# Patient Record
Sex: Female | Born: 1965 | Race: White | Hispanic: No | Marital: Single | Smoking: Former smoker
Health system: Southern US, Community
[De-identification: ages and names within clinical notes are randomized; demographics above are authoritative.]

## PROBLEM LIST (undated history)

## (undated) DIAGNOSIS — Z8639 Personal history of other endocrine, nutritional and metabolic disease: Secondary | ICD-10-CM

## (undated) DIAGNOSIS — G9332 Myalgic encephalomyelitis/chronic fatigue syndrome: Secondary | ICD-10-CM

## (undated) DIAGNOSIS — Z87898 Personal history of other specified conditions: Secondary | ICD-10-CM

## (undated) DIAGNOSIS — N920 Excessive and frequent menstruation with regular cycle: Secondary | ICD-10-CM

## (undated) DIAGNOSIS — Z9289 Personal history of other medical treatment: Secondary | ICD-10-CM

## (undated) DIAGNOSIS — F329 Major depressive disorder, single episode, unspecified: Secondary | ICD-10-CM

## (undated) DIAGNOSIS — T7840XA Allergy, unspecified, initial encounter: Secondary | ICD-10-CM

## (undated) DIAGNOSIS — M1990 Unspecified osteoarthritis, unspecified site: Secondary | ICD-10-CM

## (undated) DIAGNOSIS — E079 Disorder of thyroid, unspecified: Secondary | ICD-10-CM

## (undated) DIAGNOSIS — H5789 Other specified disorders of eye and adnexa: Secondary | ICD-10-CM

## (undated) DIAGNOSIS — R5382 Chronic fatigue, unspecified: Secondary | ICD-10-CM

## (undated) DIAGNOSIS — E890 Postprocedural hypothyroidism: Secondary | ICD-10-CM

## (undated) DIAGNOSIS — E0500 Thyrotoxicosis with diffuse goiter without thyrotoxic crisis or storm: Secondary | ICD-10-CM

## (undated) DIAGNOSIS — F32A Depression, unspecified: Secondary | ICD-10-CM

## (undated) DIAGNOSIS — K219 Gastro-esophageal reflux disease without esophagitis: Secondary | ICD-10-CM

## (undated) HISTORY — DX: Unspecified osteoarthritis, unspecified site: M19.90

## (undated) HISTORY — DX: Chronic fatigue, unspecified: R53.82

## (undated) HISTORY — DX: Allergy, unspecified, initial encounter: T78.40XA

## (undated) HISTORY — DX: Myalgic encephalomyelitis/chronic fatigue syndrome: G93.32

## (undated) HISTORY — DX: Gastro-esophageal reflux disease without esophagitis: K21.9

## (undated) HISTORY — PX: TONSILLECTOMY: SUR1361

## (undated) HISTORY — DX: Other specified disorders of eye and adnexa: H57.89

## (undated) HISTORY — DX: Disorder of thyroid, unspecified: E07.9

## (undated) HISTORY — DX: Depression, unspecified: F32.A

## (undated) HISTORY — DX: Thyrotoxicosis with diffuse goiter without thyrotoxic crisis or storm: E05.00

## (undated) HISTORY — DX: Personal history of other medical treatment: Z92.89

---
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
## 1999-03-05 ENCOUNTER — Other Ambulatory Visit: Admission: RE | Admit: 1999-03-05 | Discharge: 1999-03-05 | Payer: Self-pay | Admitting: Obstetrics and Gynecology

## 2000-03-09 ENCOUNTER — Other Ambulatory Visit: Admission: RE | Admit: 2000-03-09 | Discharge: 2000-03-09 | Payer: Self-pay | Admitting: Obstetrics and Gynecology

## 2003-05-12 HISTORY — PX: DILATION AND EVACUATION: SHX1459

## 2008-01-30 ENCOUNTER — Encounter: Admission: RE | Admit: 2008-01-30 | Discharge: 2008-01-30 | Payer: Self-pay | Admitting: Obstetrics and Gynecology

## 2009-10-09 ENCOUNTER — Ambulatory Visit: Admission: RE | Admit: 2009-10-09 | Discharge: 2009-10-09 | Payer: Self-pay | Admitting: Gynecologic Oncology

## 2015-01-04 ENCOUNTER — Ambulatory Visit: Payer: 59 | Admitting: Podiatry

## 2015-01-15 ENCOUNTER — Encounter: Payer: Self-pay | Admitting: Podiatry

## 2015-01-15 ENCOUNTER — Ambulatory Visit (INDEPENDENT_AMBULATORY_CARE_PROVIDER_SITE_OTHER): Payer: Managed Care, Other (non HMO) | Admitting: Podiatry

## 2015-01-15 VITALS — BP 110/62 | HR 65 | Resp 16 | Ht 67.0 in | Wt 160.0 lb

## 2015-01-15 DIAGNOSIS — B070 Plantar wart: Secondary | ICD-10-CM | POA: Diagnosis not present

## 2015-01-15 NOTE — Progress Notes (Signed)
   Subjective:    Patient ID: Laurie Travis, female    DOB: Dec 30, 1965, 49 y.o.   MRN: [id number]  HPI  Patient presents with a wart on their Left foot, heel. Pt stated, "hurts foot". Pt has soaked foot in epsom salt with some relief. This has been going on for the past 4 months.    Review of Systems  All other systems reviewed and are negative.      Objective:   Physical Exam        Assessment & Plan:

## 2015-01-15 NOTE — Patient Instructions (Addendum)

## 2015-01-16 ENCOUNTER — Telehealth: Payer: Self-pay | Admitting: *Deleted

## 2015-01-16 DIAGNOSIS — B078 Other viral warts: Secondary | ICD-10-CM

## 2015-01-16 DIAGNOSIS — B079 Viral wart, unspecified: Secondary | ICD-10-CM

## 2015-01-16 NOTE — Telephone Encounter (Signed)
Left heel skin wedge sent to [facility] r/o verruca.

## 2015-01-16 NOTE — Telephone Encounter (Signed)
Left message at [phone] (Home #) for patient to call me back regarding a plantar wart procedure that was done on Tuesday, 01/15/15. Waiting for a response.

## 2015-01-16 NOTE — Progress Notes (Signed)
Subjective:     Patient ID: Laurie Travis, female   DOB: 12/04/1965, 49 y.o.   MRN: [id number]  HPI patient presents with painful lesion on the bottom of the left heel that's been present for several months. Patient does not remember specific injury and has tried to soak it in Epsom salts   Review of Systems  All other systems reviewed and are negative.      Objective:   Physical Exam  Constitutional: She is oriented to person, place, and time.  Cardiovascular: Intact distal pulses.   Musculoskeletal: Normal range of motion.  Neurological: She is oriented to person, place, and time.  Skin: Skin is warm.  Nursing note and vitals reviewed.  neurovascular status intact muscle strength adequate range of motion within normal limits and patient noted to have a lesion plantar aspect left heel that upon debridement shows pinpoint bleeding and is painful to lateral pressure. It measures approximately 7 mm x 8 mm and is again painful. Patient has good digital perfusion and is well oriented 3     Assessment:     Probable verruca plantaris plantar aspect left heel and cannot rule out other lesion or foreign body    Plan:     H&P and condition reviewed with patient. Today I injected with 60 mg Xylocaine with epinephrine and after appropriate prep to the area I removed the lesion in toto and applied phenol to the base and applied sterile dressing with padding. Patient was given instructions on continued soaks and padding and will be seen back to recheck and I did sent for pathological evaluation

## 2015-01-31 ENCOUNTER — Telehealth: Payer: Self-pay | Admitting: *Deleted

## 2015-01-31 NOTE — Telephone Encounter (Signed)
Dr. Charlsie Merles reviewed pt's biopsy 01/15/2015 as a plantar wart.  Left message for pt to call for results.

## 2015-03-18 ENCOUNTER — Encounter: Payer: Self-pay | Admitting: Podiatry

## 2016-01-02 NOTE — H&P (Addendum)
Stefano GaulJan Barich is an 50 y.o. female with menorrhagia presents for surgical mngt.  Pt has very heavy menses with clotting and severe cramps.     Menstrual History:  No LMP recorded.    Past Medical History:  Diagnosis Date  . CFS (chronic fatigue syndrome)   . History of ETT   . Palpitations 09/2011   NORMAL 24 HR HOLTER  . Postablative hypoparathyroidism   . Toxic thyroid nodule 1999    No past surgical history on file.  Family History  Problem Relation Age of Onset  . Heart Problems Father   . CAD Father   . Alzheimer's disease Maternal Grandfather     Social History:  reports that she has quit smoking. She quit smokeless tobacco use about 17 years ago. She reports that she drinks about 0.6 - 1.2 oz of alcohol per week . Her drug history is not on file.  Allergies:  Allergies  Allergen Reactions  . Pork-Derived Products Diarrhea  . Sulfa Antibiotics Other (See Comments)    ANAPHYLAXIS     No prescriptions prior to admission.  Pt takes multiple OTC supplements   ROS  AF, VSS Physical Exam  Gen - NAD CV - RRR Lungs - clear Abd - soft, NT Ext - NT PV - mobile, NT uterus.  No adnexal masses  Pap - negative US: uterus and adnexa wnl, no free fluid  Assessment/Plan: Menorrhagia Hysteroscopy, D&C, endometrial ablation R/b/a discussed, questions answered, informed consent  Nathanyl Andujo 01/02/2016, 1:52 PM

## 2016-01-08 ENCOUNTER — Encounter (HOSPITAL_BASED_OUTPATIENT_CLINIC_OR_DEPARTMENT_OTHER): Payer: Self-pay | Admitting: *Deleted

## 2016-01-08 NOTE — Progress Notes (Signed)
NPO AFTER MN.  ARRIVE AT 0600. NEEDS URINE PREG.  GETTING CBC DONE Thursday 01-09-2016.  WILL TAKE TIROSINT AM DOS W/ SIPS OF WATER.

## 2016-01-09 DIAGNOSIS — N920 Excessive and frequent menstruation with regular cycle: Secondary | ICD-10-CM | POA: Diagnosis present

## 2016-01-09 DIAGNOSIS — N858 Other specified noninflammatory disorders of uterus: Secondary | ICD-10-CM | POA: Diagnosis not present

## 2016-01-09 DIAGNOSIS — Z87891 Personal history of nicotine dependence: Secondary | ICD-10-CM | POA: Diagnosis not present

## 2016-01-09 LAB — CBC
HCT: 38.9 % (ref 36.0–46.0)
HEMOGLOBIN: 13.1 g/dL (ref 12.0–15.0)
MCH: 33.2 pg (ref 26.0–34.0)
MCHC: 33.7 g/dL (ref 30.0–36.0)
MCV: 98.7 fL (ref 78.0–100.0)
Platelets: 291 10*3/uL (ref 150–400)
RBC: 3.94 MIL/uL (ref 3.87–5.11)
RDW: 12.5 % (ref 11.5–15.5)
WBC: 8.3 10*3/uL (ref 4.0–10.5)

## 2016-01-13 ENCOUNTER — Encounter (HOSPITAL_BASED_OUTPATIENT_CLINIC_OR_DEPARTMENT_OTHER): Payer: Self-pay | Admitting: Anesthesiology

## 2016-01-13 NOTE — Anesthesia Preprocedure Evaluation (Addendum)
Anesthesia Evaluation  Patient identified by MRN, date of birth, ID band Patient awake    Reviewed: Allergy & Precautions, NPO status , Patient's Chart, lab work & pertinent test results  Airway Mallampati: III       Dental  (+) Chipped,    Pulmonary former smoker,    Pulmonary exam normal breath sounds clear to auscultation       Cardiovascular Normal cardiovascular exam Rhythm:Regular Rate:Normal  Hx/o Palpitations- Cardiac w/u negative   Neuro/Psych negative neurological ROS  negative psych ROS   GI/Hepatic negative GI ROS, Neg liver ROS,   Endo/Other  Hypothyroidism Hx/o Grave's disease- S/P RAI therapy  Renal/GU negative Renal ROS  negative genitourinary   Musculoskeletal Chronic fatigue syndrome   Abdominal   Peds  Hematology negative hematology ROS (+)   Anesthesia Other Findings   Reproductive/Obstetrics Menorrhagia                            Lab Results  Component Value Date   WBC 8.3 01/09/2016   HGB 13.1 01/09/2016   HCT 38.9 01/09/2016   MCV 98.7 01/09/2016   PLT 291 01/09/2016     Anesthesia Physical Anesthesia Plan  ASA: II  Anesthesia Plan: General   Post-op Pain Management:    Induction: Intravenous  Airway Management Planned: LMA  Additional Equipment:   Intra-op Plan:   Post-operative Plan: Extubation in OR  Informed Consent: I have reviewed the patients History and Physical, chart, labs and discussed the procedure including the risks, benefits and alternatives for the proposed anesthesia with the patient or authorized representative who has indicated his/her understanding and acceptance.   Dental advisory given  Plan Discussed with: Anesthesiologist, CRNA and Surgeon  Anesthesia Plan Comments:         Anesthesia Quick Evaluation

## 2016-01-14 ENCOUNTER — Ambulatory Visit (HOSPITAL_BASED_OUTPATIENT_CLINIC_OR_DEPARTMENT_OTHER)
Admission: RE | Admit: 2016-01-14 | Discharge: 2016-01-14 | Disposition: A | Discharge status: Home / Self Care | Payer: BLUE CROSS/BLUE SHIELD | Source: Ambulatory Visit | Attending: Obstetrics and Gynecology | Admitting: Obstetrics and Gynecology

## 2016-01-14 ENCOUNTER — Encounter (HOSPITAL_BASED_OUTPATIENT_CLINIC_OR_DEPARTMENT_OTHER): Payer: Self-pay | Admitting: *Deleted

## 2016-01-14 ENCOUNTER — Encounter (HOSPITAL_BASED_OUTPATIENT_CLINIC_OR_DEPARTMENT_OTHER)
Admission: RE | Disposition: A | Discharge status: Home / Self Care | Payer: Self-pay | Source: Ambulatory Visit | Attending: Obstetrics and Gynecology

## 2016-01-14 ENCOUNTER — Ambulatory Visit (HOSPITAL_BASED_OUTPATIENT_CLINIC_OR_DEPARTMENT_OTHER): Payer: BLUE CROSS/BLUE SHIELD | Admitting: Anesthesiology

## 2016-01-14 DIAGNOSIS — N920 Excessive and frequent menstruation with regular cycle: Secondary | ICD-10-CM | POA: Diagnosis not present

## 2016-01-14 DIAGNOSIS — Z87891 Personal history of nicotine dependence: Secondary | ICD-10-CM | POA: Insufficient documentation

## 2016-01-14 DIAGNOSIS — N858 Other specified noninflammatory disorders of uterus: Secondary | ICD-10-CM | POA: Insufficient documentation

## 2016-01-14 HISTORY — DX: Personal history of other endocrine, nutritional and metabolic disease: Z86.39

## 2016-01-14 HISTORY — DX: Postprocedural hypothyroidism: E89.0

## 2016-01-14 HISTORY — DX: Personal history of other specified conditions: Z87.898

## 2016-01-14 HISTORY — PX: DILITATION & CURRETTAGE/HYSTROSCOPY WITH NOVASURE ABLATION: SHX5568

## 2016-01-14 HISTORY — DX: Excessive and frequent menstruation with regular cycle: N92.0

## 2016-01-14 LAB — POCT PREGNANCY, URINE: PREG TEST UR: NEGATIVE

## 2016-01-14 SURGERY — DILATATION & CURETTAGE/HYSTEROSCOPY WITH NOVASURE ABLATION
Anesthesia: General

## 2016-01-14 MED ORDER — IBUPROFEN 600 MG PO TABS: 600.0000 mg | ORAL_TABLET | Freq: Four times a day (QID) | ORAL | 1 refills | Status: DC | PRN

## 2016-01-14 MED ORDER — MIDAZOLAM HCL 2 MG/2ML IJ SOLN
INTRAMUSCULAR | Status: AC
  Filled 2016-01-14: qty 2

## 2016-01-14 MED ORDER — CEFOTETAN DISODIUM-DEXTROSE 2-2.08 GM-% IV SOLR
INTRAVENOUS | Status: AC
  Filled 2016-01-14: qty 50

## 2016-01-14 MED ORDER — MIDAZOLAM HCL 5 MG/5ML IJ SOLN
INTRAMUSCULAR | Status: DC | PRN
  Administered 2016-01-14: 2 mg via INTRAVENOUS

## 2016-01-14 MED ORDER — LACTATED RINGERS IR SOLN
Status: DC | PRN
  Administered 2016-01-14: 3000 mL

## 2016-01-14 MED ORDER — METOCLOPRAMIDE HCL 5 MG/ML IJ SOLN
10.0000 mg | Freq: Once | INTRAMUSCULAR | Status: DC | PRN
  Filled 2016-01-14: qty 2

## 2016-01-14 MED ORDER — ONDANSETRON HCL 4 MG/2ML IJ SOLN
INTRAMUSCULAR | Status: DC | PRN
  Administered 2016-01-14: 4 mg via INTRAVENOUS

## 2016-01-14 MED ORDER — HYDROCODONE-ACETAMINOPHEN 7.5-325 MG PO TABS
1.0000 | ORAL_TABLET | Freq: Once | ORAL | Status: DC | PRN
  Filled 2016-01-14: qty 1

## 2016-01-14 MED ORDER — LIDOCAINE HCL 1 % IJ SOLN
INTRAMUSCULAR | Status: DC | PRN
  Administered 2016-01-14: 1 mL
  Administered 2016-01-14: 5 mL

## 2016-01-14 MED ORDER — FENTANYL CITRATE (PF) 100 MCG/2ML IJ SOLN
INTRAMUSCULAR | Status: DC | PRN
  Administered 2016-01-14: 25 ug via INTRAVENOUS
  Administered 2016-01-14: 50 ug via INTRAVENOUS
  Administered 2016-01-14: 25 ug via INTRAVENOUS

## 2016-01-14 MED ORDER — DEXTROSE 5 % IV SOLN
2.0000 g | INTRAVENOUS | Status: AC
  Administered 2016-01-14: 2 g via INTRAVENOUS
  Filled 2016-01-14: qty 2

## 2016-01-14 MED ORDER — DEXAMETHASONE SODIUM PHOSPHATE 4 MG/ML IJ SOLN
INTRAMUSCULAR | Status: DC | PRN
  Administered 2016-01-14: 10 mg via INTRAVENOUS

## 2016-01-14 MED ORDER — DEXAMETHASONE SODIUM PHOSPHATE 10 MG/ML IJ SOLN
INTRAMUSCULAR | Status: AC
  Filled 2016-01-14: qty 1

## 2016-01-14 MED ORDER — PROPOFOL 10 MG/ML IV BOLUS
INTRAVENOUS | Status: AC
  Filled 2016-01-14: qty 20

## 2016-01-14 MED ORDER — PROPOFOL 10 MG/ML IV BOLUS
INTRAVENOUS | Status: DC | PRN
  Administered 2016-01-14: 200 mg via INTRAVENOUS

## 2016-01-14 MED ORDER — FENTANYL CITRATE (PF) 100 MCG/2ML IJ SOLN
INTRAMUSCULAR | Status: AC
  Filled 2016-01-14: qty 2

## 2016-01-14 MED ORDER — LACTATED RINGERS IV SOLN
INTRAVENOUS | Status: DC
  Administered 2016-01-14: 07:00:00 via INTRAVENOUS
  Filled 2016-01-14: qty 1000

## 2016-01-14 MED ORDER — MEPERIDINE HCL 25 MG/ML IJ SOLN
6.2500 mg | INTRAMUSCULAR | Status: DC | PRN
  Filled 2016-01-14: qty 1

## 2016-01-14 MED ORDER — ONDANSETRON HCL 4 MG/2ML IJ SOLN
INTRAMUSCULAR | Status: AC
  Filled 2016-01-14: qty 2

## 2016-01-14 MED ORDER — KETOROLAC TROMETHAMINE 30 MG/ML IJ SOLN
INTRAMUSCULAR | Status: AC
  Filled 2016-01-14: qty 1

## 2016-01-14 MED ORDER — FENTANYL CITRATE (PF) 100 MCG/2ML IJ SOLN
25.0000 ug | INTRAMUSCULAR | Status: DC | PRN
  Filled 2016-01-14: qty 1

## 2016-01-14 MED ORDER — LIDOCAINE 2% (20 MG/ML) 5 ML SYRINGE
INTRAMUSCULAR | Status: AC
  Filled 2016-01-14: qty 5

## 2016-01-14 MED ORDER — OXYCODONE-ACETAMINOPHEN 5-325 MG PO TABS: 1.0000 | ORAL_TABLET | ORAL | 0 refills | Status: DC | PRN

## 2016-01-14 MED ORDER — KETOROLAC TROMETHAMINE 30 MG/ML IJ SOLN
INTRAMUSCULAR | Status: DC | PRN
  Administered 2016-01-14: 30 mg via INTRAVENOUS

## 2016-01-14 MED ORDER — LIDOCAINE 2% (20 MG/ML) 5 ML SYRINGE
INTRAMUSCULAR | Status: DC | PRN
  Administered 2016-01-14: 60 mg via INTRAVENOUS

## 2016-01-14 SURGICAL SUPPLY — 34 items
ABLATOR ENDOMETRIAL BIPOLAR (ABLATOR) ×2 IMPLANT
BIPOLAR CUTTING LOOP 21FR (ELECTRODE)
CANISTER SUCTION 2500CC (MISCELLANEOUS) ×4 IMPLANT
CATH ROBINSON RED A/P 16FR (CATHETERS) ×3 IMPLANT
COVER BACK TABLE 60X90IN (DRAPES) ×3 IMPLANT
DRAPE HYSTEROSCOPY (DRAPE) ×3 IMPLANT
DRAPE LG THREE QUARTER DISP (DRAPES) ×3 IMPLANT
DRSG TELFA 3X8 NADH (GAUZE/BANDAGES/DRESSINGS) ×3 IMPLANT
GAUZE VASELINE 1X8 (GAUZE/BANDAGES/DRESSINGS) IMPLANT
GLOVE BIO SURGEON STRL SZ 6.5 (GLOVE) ×5 IMPLANT
GLOVE BIO SURGEONS STRL SZ 6.5 (GLOVE) ×3
GLOVE BIOGEL PI IND STRL 7.0 (GLOVE) ×1 IMPLANT
GLOVE BIOGEL PI INDICATOR 7.0 (GLOVE) ×2
GOWN STRL REUS W/ TWL LRG LVL3 (GOWN DISPOSABLE) ×1 IMPLANT
GOWN STRL REUS W/ TWL XL LVL3 (GOWN DISPOSABLE) ×1 IMPLANT
GOWN STRL REUS W/TWL LRG LVL3 (GOWN DISPOSABLE) ×3
GOWN STRL REUS W/TWL XL LVL3 (GOWN DISPOSABLE) ×3
IV LACTATED RINGER IRRG 3000ML (IV SOLUTION) ×3
IV LR IRRIG 3000ML ARTHROMATIC (IV SOLUTION) IMPLANT
KIT ROOM TURNOVER WOR (KITS) ×3 IMPLANT
LEGGING LITHOTOMY PAIR STRL (DRAPES) ×3 IMPLANT
LOOP CUTTING BIPOLAR 21FR (ELECTRODE) IMPLANT
NDL SPNL 22GX3.5 QUINCKE BK (NEEDLE) ×1 IMPLANT
NEEDLE SPNL 22GX3.5 QUINCKE BK (NEEDLE) ×3 IMPLANT
NS IRRIG 500ML POUR BTL (IV SOLUTION) ×2 IMPLANT
PACK BASIN DAY SURGERY FS (CUSTOM PROCEDURE TRAY) ×3 IMPLANT
PAD DRESSING TELFA 3X8 NADH (GAUZE/BANDAGES/DRESSINGS) ×1 IMPLANT
PAD OB MATERNITY 4.3X12.25 (PERSONAL CARE ITEMS) ×3 IMPLANT
PAD PREP 24X48 CUFFED NSTRL (MISCELLANEOUS) ×3 IMPLANT
SYR CONTROL 10ML LL (SYRINGE) ×3 IMPLANT
TOWEL OR 17X24 6PK STRL BLUE (TOWEL DISPOSABLE) ×6 IMPLANT
TRAY DSU PREP LF (CUSTOM PROCEDURE TRAY) ×3 IMPLANT
TUBE CONNECTING 12'X1/4 (SUCTIONS)
TUBE CONNECTING 12X1/4 (SUCTIONS) IMPLANT

## 2016-01-14 NOTE — Anesthesia Procedure Notes (Signed)
Procedure Name: LMA Insertion Date/Time: 01/14/2016 7:28 AM Performed by: Renella CunasHAZEL, Aleighya Mcanelly D Pre-anesthesia Checklist: Patient identified, Emergency Drugs available, Suction available and Patient being monitored Patient Re-evaluated:Patient Re-evaluated prior to inductionOxygen Delivery Method: Circle system utilized Preoxygenation: Pre-oxygenation with 100% oxygen Intubation Type: IV induction Ventilation: Mask ventilation without difficulty LMA: LMA inserted LMA Size: 4.0 Number of attempts: 1 Airway Equipment and Method: Bite block Placement Confirmation: positive ETCO2 Tube secured with: Tape Dental Injury: Teeth and Oropharynx as per pre-operative assessment

## 2016-01-14 NOTE — Transfer of Care (Signed)
Immediate Anesthesia Transfer of Care Note  Patient: Laurie Travis  Procedure(s) Performed: Procedure(s) (LRB): DILATATION & CURETTAGE/HYSTEROSCOPY WITH NOVASURE ABLATION (N/A)  Patient Location: PACU  Anesthesia Type: General  Level of Consciousness: awake, oriented, sedated and patient cooperative  Airway & Oxygen Therapy: Patient Spontanous Breathing and Patient connected to face mask oxygen  Post-op Assessment: Report given to PACU RN and Post -op Vital signs reviewed and stable  Post vital signs: Reviewed and stable  Complications: No apparent anesthesia complications Last Vitals:  Vitals:   01/14/16 0613 01/14/16 0801  BP: 121/69   Pulse: (!) 58   Resp: 16 12  Temp: 36.7 C 36.6 C

## 2016-01-14 NOTE — Anesthesia Postprocedure Evaluation (Signed)
Anesthesia Post Note  Patient: Laurie GaulJan Travis  Procedure(s) Performed: Procedure(s) (LRB): DILATATION & CURETTAGE/HYSTEROSCOPY WITH NOVASURE ABLATION (N/A)  Patient location during evaluation: PACU Anesthesia Type: General Level of consciousness: awake and alert and oriented Pain management: pain level controlled Vital Signs Assessment: post-procedure vital signs reviewed and stable Respiratory status: spontaneous breathing, nonlabored ventilation and respiratory function stable Cardiovascular status: blood pressure returned to baseline and stable Postop Assessment: no signs of nausea or vomiting Anesthetic complications: no    Last Vitals:  Vitals:   01/14/16 0815 01/14/16 0830  BP: (!) 113/58 108/83  Pulse: 60 (!) 56  Resp: 16 14  Temp:      Last Pain:  Vitals:   01/14/16 0801  TempSrc:   PainSc: 0-No pain                 [name] A.

## 2016-01-14 NOTE — Discharge Instructions (Signed)

## 2016-01-15 ENCOUNTER — Encounter (HOSPITAL_BASED_OUTPATIENT_CLINIC_OR_DEPARTMENT_OTHER): Payer: Self-pay | Admitting: Obstetrics and Gynecology

## 2016-01-15 NOTE — Op Note (Signed)
[name], Laurie Travis NO.:  [id number]  MEDICAL RECORD NO.:  [id number]  LOCATION:                                 FACILITY:  PHYSICIAN:  Zelphia Cairo, MD         DATE OF BIRTH:  DATE OF PROCEDURE:  01/14/2016 DATE OF DISCHARGE:                              OPERATIVE REPORT   PREOPERATIVE DIAGNOSIS:  Menorrhagia.  POSTOPERATIVE DIAGNOSIS:  Menorrhagia.  OPERATIONS: 1. Paracervical block. 2. Hysteroscopy, dilation and curettage. 3. NovaSure endometrial ablation.  SURGEON:  Zelphia Cairo, MD.  BLOOD LOSS:  Minimal.  SPECIMEN:  Endometrial curettings.  ANESTHESIA:  General with local.  PROCEDURE IN DETAIL:  The patient was taken to the operating room. After informed consent was obtained, she was given general anesthesia, placed in the dorsal lithotomy position using Allen stirrups.  She was prepped and draped in a sterile fashion, and an in-and-out catheter was used to drain her bladder for an unmeasured amount of urine.  Bivalve speculum was placed in the vagina.  1% lidocaine was used to perform a paracervical block.  Single-tooth tenaculum was attached to the anterior lip of the cervix.  The cervix was serially dilated using Pratt dilators, and the uterine cavity was measured using a sound. Hysteroscope was then inserted, and a survey was performed.  There was some fluffy endometrial tissue, but no masses, polyps, or fibroids were identified.  Hysteroscope was removed, and a gentle curetting was then performed.  Specimen was placed on Telfa and passed off to be sent to Pathology.  The NovaSure device was then inserted.  The cavity width was measured, and the NovaSure ablation was performed using standard manufacture guidelines.  Once the cycle was complete, the device was removed.  All instruments were then removed from the vagina.  The cervix was hemostatic.  She was extubated and taken to the recovery room in stable condition.  Sponge, lap,  needle, and instrument counts were correct x2.    Zelphia Cairo, MD    GA/MEDQ  D:  01/14/2016  T:  01/15/2016  Job:  [id number]

## 2019-07-08 ENCOUNTER — Ambulatory Visit: Payer: Commercial Managed Care - PPO | Attending: Internal Medicine

## 2019-07-08 ENCOUNTER — Other Ambulatory Visit: Payer: Self-pay

## 2019-07-08 DIAGNOSIS — Z23 Encounter for immunization: Secondary | ICD-10-CM | POA: Insufficient documentation

## 2019-07-08 NOTE — Progress Notes (Signed)
   Covid-19 Vaccination Clinic  Name:  Chaunta Bejarano    MRN: [id number] DOB: [birth year]  07/08/2019  Ms. Heinemann was observed post Covid-19 immunization for 15 minutes without incidence. She was provided with Vaccine Information Sheet and instruction to access the V-Safe system.   Ms. Vantil was instructed to call 911 with any severe reactions post vaccine: Marland Kitchen Difficulty breathing  . Swelling of your face and throat  . A fast heartbeat  . A bad rash all over your body  . Dizziness and weakness    Immunizations Administered    Name Date Dose VIS Date Route   Pfizer COVID-19 Vaccine 07/08/2019 10:03 AM 0.3 mL 04/21/2019 Intramuscular   Manufacturer: [employer], [employer]   Lot: [id number]   NDC: [address]-6

## 2019-07-13 ENCOUNTER — Encounter: Payer: Self-pay | Admitting: Gastroenterology

## 2019-07-20 ENCOUNTER — Other Ambulatory Visit: Payer: Self-pay

## 2019-07-20 ENCOUNTER — Ambulatory Visit (INDEPENDENT_AMBULATORY_CARE_PROVIDER_SITE_OTHER): Payer: Commercial Managed Care - PPO | Admitting: Podiatry

## 2019-07-20 ENCOUNTER — Encounter: Payer: Self-pay | Admitting: Podiatry

## 2019-07-20 VITALS — Temp 97.0°F

## 2019-07-20 DIAGNOSIS — B070 Plantar wart: Secondary | ICD-10-CM

## 2019-07-20 NOTE — Patient Instructions (Signed)

## 2019-07-20 NOTE — Progress Notes (Signed)
Subjective:   Patient ID: Laurie Travis, female   DOB: 54 y.o.   MRN: [id number]   HPI Patient presents with a chronic lesion on the bottom of the left heel which has become sore stating that after we had done it a number of years ago did very well but it has become aggravated again over the last few months and she knows that there is something there   Review of Systems  All other systems reviewed and are negative.       Objective:  Physical Exam Vitals and nursing note reviewed.  Constitutional:      Appearance: She is well-developed.  Pulmonary:     Effort: Pulmonary effort is normal.  Musculoskeletal:        General: Normal range of motion.  Skin:    General: Skin is warm.  Neurological:     Mental Status: She is alert.   Patient presents stating that this has really been bothering her today neurovascular status noted to be intact.  Patient is found to have a 1.2 cm mass plantar aspect left heel that appears to be slightly off center from the previous one that was done that is painful when pressed and make shoe gear difficult.  Patient has good digital perfusion well oriented x3      Assessment:  Verruca plantaris plantar aspect left with pain     Plan:  H&P condition reviewed and at this point we discussed different treatment options and she is opted for excision.  I did sterile prep the area I did inject 60 mg Xylocaine with epinephrine around the area in order to stop the blood flow and then using sterile instrumentation I excised the mass I exposed base I utilize bone curettage and phenol sent this off for pathology and applied sterile dressing.  Gave instructions for elevation soaks and patient will be seen back and we will get the results of pathology

## 2019-07-26 ENCOUNTER — Other Ambulatory Visit: Payer: Self-pay

## 2019-07-26 ENCOUNTER — Ambulatory Visit (AMBULATORY_SURGERY_CENTER): Payer: Self-pay | Admitting: *Deleted

## 2019-07-26 VITALS — Temp 96.8°F | Ht 66.0 in | Wt 213.0 lb

## 2019-07-26 DIAGNOSIS — Z1211 Encounter for screening for malignant neoplasm of colon: Secondary | ICD-10-CM

## 2019-07-26 MED ORDER — SUPREP BOWEL PREP KIT 17.5-3.13-1.6 GM/177ML PO SOLN: 1.0000 | Freq: Once | ORAL | 0 refills | Status: AC

## 2019-07-26 NOTE — Progress Notes (Signed)
Pt will receive COV Vacc #2 07-29-2019- she will not require a pre screen cov test for gher 4-9 colon   No egg or soy allergy known to patient  No issues with past sedation with any surgeries  or procedures, no intubation problems  No diet pills per patient No home 02 use per patient  No blood thinners per patient  Pt denies issues with constipation  No A fib or A flutter  EMMI video sent to pt's e mail   Due to the COVID-19 pandemic we are asking patients to follow these guidelines. Please only bring one care partner. Please be aware that your care partner may wait in the car in the parking lot or if they feel like they will be too hot to wait in the car, they may wait in the lobby on the 4th floor. All care partners are required to wear a mask the entire time (we do not have any that we can provide them), they need to practice social distancing, and we will do a Covid check for all patient's and care partners when you arrive. Also we will check their temperature and your temperature. If the care partner waits in their car they need to stay in the parking lot the entire time and we will call them on their cell phone when the patient is ready for discharge so they can bring the car to the front of the building. Also all patient's will need to wear a mask into building. Suprep code to pharmacy and coupon to pt ion pv

## 2019-07-29 ENCOUNTER — Ambulatory Visit: Payer: Commercial Managed Care - PPO | Attending: Internal Medicine

## 2019-07-29 DIAGNOSIS — Z23 Encounter for immunization: Secondary | ICD-10-CM

## 2019-07-29 NOTE — Progress Notes (Signed)
   Covid-19 Vaccination Clinic  Name:  Laurie Travis    MRN: [id number] DOB: 1966/02/11  07/29/2019  Laurie Travis was observed post Covid-19 immunization for 15 minutes without incident. She was provided with Vaccine Information Sheet and instruction to access the V-Safe system.   Laurie Travis was instructed to call 911 with any severe reactions post vaccine: Marland Kitchen Difficulty breathing  . Swelling of face and throat  . A fast heartbeat  . A bad rash all over body  . Dizziness and weakness   Immunizations Administered    Name Date Dose VIS Date Route   Pfizer COVID-19 Vaccine 07/29/2019 10:55 AM 0.3 mL 04/21/2019 Intramuscular   Manufacturer: [employer], [employer]   Lot: [id number]   NDC: [address]-2

## 2019-08-02 ENCOUNTER — Ambulatory Visit: Payer: Self-pay

## 2019-08-17 ENCOUNTER — Encounter: Payer: Self-pay | Admitting: Gastroenterology

## 2019-08-18 ENCOUNTER — Ambulatory Visit (AMBULATORY_SURGERY_CENTER): Payer: Commercial Managed Care - PPO | Admitting: Gastroenterology

## 2019-08-18 ENCOUNTER — Encounter: Payer: Self-pay | Admitting: Gastroenterology

## 2019-08-18 ENCOUNTER — Other Ambulatory Visit: Payer: Self-pay

## 2019-08-18 VITALS — BP 131/63 | HR 62 | Temp 97.5°F | Resp 16 | Ht 66.0 in | Wt 213.0 lb

## 2019-08-18 DIAGNOSIS — Z1211 Encounter for screening for malignant neoplasm of colon: Secondary | ICD-10-CM | POA: Diagnosis not present

## 2019-08-18 MED ORDER — SODIUM CHLORIDE 0.9 % IV SOLN: 500.0000 mL | Freq: Once | INTRAVENOUS | Status: DC

## 2019-08-18 NOTE — Progress Notes (Signed)
Temp by JB Vitals by CW  Pt's states no medical or surgical changes since previsit or office visit.  

## 2019-08-18 NOTE — Progress Notes (Signed)
To PACU, VSS. Report to [email or web address] 

## 2019-08-18 NOTE — Op Note (Signed)
[place] Patient Name: Laurie Travis Procedure Date: 08/18/2019 1:30 PM MRN: [id number] Endoscopist: Mauri Pole , MD Age: 54 Referring MD:  Date of Birth: July 15, 1965 Gender: Female Account #: [id number] Procedure:                Colonoscopy Indications:              Screening for colorectal malignant neoplasm Medicines:                Monitored Anesthesia Care Procedure:                Pre-Anesthesia Assessment:                           - Prior to the procedure, a History and Physical                            was performed, and patient medications and                            allergies were reviewed. The patient's tolerance of                            previous anesthesia was also reviewed. The risks                            and benefits of the procedure and the sedation                            options and risks were discussed with the patient.                            All questions were answered, and informed consent                            was obtained. Prior Anticoagulants: The patient has                            taken no previous anticoagulant or antiplatelet                            agents. ASA Grade Assessment: II - A patient with                            mild systemic disease. After reviewing the risks                            and benefits, the patient was deemed in                            satisfactory condition to undergo the procedure.                           After obtaining informed consent, the colonoscope  was passed under direct vision. Throughout the                            procedure, the patient's blood pressure, pulse, and                            oxygen saturations were monitored continuously. The                            Colonoscope was introduced through the anus and                            advanced to the the cecum, identified by                            appendiceal orifice and  ileocecal valve. The                            colonoscopy was performed without difficulty. The                            patient tolerated the procedure well. The quality                            of the bowel preparation was excellent. The                            ileocecal valve, appendiceal orifice, and rectum                            were photographed. Scope In: 1:35:52 PM Scope Out: 1:55:06 PM Scope Withdrawal Time: 0 hours 15 minutes 36 seconds  Total Procedure Duration: 0 hours 19 minutes 14 seconds  Findings:                 The perianal and digital rectal examinations were                            normal.                           Non-bleeding internal hemorrhoids were found during                            retroflexion. The hemorrhoids were small.                           The exam was otherwise without abnormality. Complications:            No immediate complications. Estimated Blood Loss:     Estimated blood loss was minimal. Impression:               - Non-bleeding internal hemorrhoids.                           - The examination was otherwise normal.                           -  No specimens collected. Recommendation:           - Patient has a contact number available for                            emergencies. The signs and symptoms of potential                            delayed complications were discussed with the                            patient. Return to normal activities tomorrow.                            Written discharge instructions were provided to the                            patient.                           - Resume previous diet.                           - Continue present medications.                           - Repeat colonoscopy in 10 years for screening                            purposes. Napoleon Form, MD 08/18/2019 2:00:36 PM This report has been signed electronically.

## 2019-08-18 NOTE — Patient Instructions (Addendum)
Impression/Recommendations:  Hemorrhoid handout given to patient.  Resume previous diet. Continue present medications.  Repeat colonoscopy I 10 years for screening purposes.  Call Dr. Elana Alm office if interested in banding procedure for hemorrhoids.    YOU HAD AN ENDOSCOPIC PROCEDURE TODAY AT THE [facility]:   Refer to the procedure report that was given to you for any specific questions about what was found during the examination.  If the procedure report does not answer your questions, please call your gastroenterologist to clarify.  If you requested that your care partner not be given the details of your procedure findings, then the procedure report has been included in a sealed envelope for you to review at your convenience later.  YOU SHOULD EXPECT: Some feelings of bloating in the abdomen. Passage of more gas than usual.  Walking can help get rid of the air that was put into your GI tract during the procedure and reduce the bloating. If you had a lower endoscopy (such as a colonoscopy or flexible sigmoidoscopy) you may notice spotting of blood in your stool or on the toilet paper. If you underwent a bowel prep for your procedure, you may not have a normal bowel movement for a few days.  Please Note:  You might notice some irritation and congestion in your nose or some drainage.  This is from the oxygen used during your procedure.  There is no need for concern and it should clear up in a day or so.  SYMPTOMS TO REPORT IMMEDIATELY:   Following lower endoscopy (colonoscopy or flexible sigmoidoscopy):  Excessive amounts of blood in the stool  Significant tenderness or worsening of abdominal pains  Swelling of the abdomen that is new, acute  Fever of 100F or higher  For urgent or emergent issues, a gastroenterologist can be reached at any hour by calling [phone]. Do not use MyChart messaging for urgent concerns.    DIET:  We do recommend a small meal at  first, but then you may proceed to your regular diet.  Drink plenty of fluids but you should avoid alcoholic beverages for 24 hours.  ACTIVITY:  You should plan to take it easy for the rest of today and you should NOT DRIVE or use heavy machinery until tomorrow (because of the sedation medicines used during the test).    FOLLOW UP: Our staff will call the number listed on your records 48-72 hours following your procedure to check on you and address any questions or concerns that you may have regarding the information given to you following your procedure. If we do not reach you, we will leave a message.  We will attempt to reach you two times.  During this call, we will ask if you have developed any symptoms of COVID 19. If you develop any symptoms (ie: fever, flu-like symptoms, shortness of breath, cough etc.) before then, please call [phone].  If you test positive for Covid 19 in the 2 weeks post procedure, please call and report this information to [place].    If any biopsies were taken you will be contacted by phone or by letter within the next 1-3 weeks.  Please call us at [phone] if you have not heard about the biopsies in 3 weeks.    SIGNATURES/CONFIDENTIALITY: You and/or your care partner have signed paperwork which will be entered into your electronic medical record.  These signatures attest to the fact that that the information above on your After Visit Summary has been reviewed  and is understood.  Full responsibility of the confidentiality of this discharge information lies with you and/or your care-partner. 

## 2019-08-22 ENCOUNTER — Telehealth: Payer: Self-pay

## 2019-08-22 NOTE — Telephone Encounter (Signed)
  Follow up Call-  Call back number 08/18/2019  Post procedure Call Back phone  # [phone]  Permission to leave phone message Yes  Some recent data might be hidden     Patient questions:  Do you have a fever, pain , or abdominal swelling? No. Pain Score  0 *  Have you tolerated food without any problems? Yes.    Have you been able to return to your normal activities? Yes.    Do you have any questions about your discharge instructions: Diet   No. Medications  No. Follow up visit  No.  Do you have questions or concerns about your Care? No.  Actions: * If pain score is 4 or above: No action needed, pain <4. 1. Have you developed a fever since your procedure? no  2.   Have you had an respiratory symptoms (SOB or cough) since your procedure? no  3.   Have you tested positive for COVID 19 since your procedure no  4.   Have you had any family members/close contacts diagnosed with the COVID 19 since your procedure?  no   If yes to any of these questions please route to Laverna Peace, RN and Charlett Lango, RN

## 2020-02-02 ENCOUNTER — Other Ambulatory Visit: Payer: Self-pay | Admitting: *Deleted

## 2020-02-02 ENCOUNTER — Other Ambulatory Visit: Payer: Self-pay | Admitting: Surgery

## 2020-02-02 DIAGNOSIS — E0500 Thyrotoxicosis with diffuse goiter without thyrotoxic crisis or storm: Secondary | ICD-10-CM

## 2020-02-15 ENCOUNTER — Ambulatory Visit
Admission: RE | Admit: 2020-02-15 | Discharge: 2020-02-15 | Disposition: A | Discharge status: Home / Self Care | Payer: Commercial Managed Care - PPO | Source: Ambulatory Visit | Attending: Surgery | Admitting: Surgery

## 2020-02-15 DIAGNOSIS — E0500 Thyrotoxicosis with diffuse goiter without thyrotoxic crisis or storm: Secondary | ICD-10-CM

## 2020-03-07 ENCOUNTER — Encounter: Payer: Self-pay | Admitting: Neurology

## 2020-05-22 ENCOUNTER — Encounter: Payer: Self-pay | Admitting: Diagnostic Neuroimaging

## 2020-05-22 ENCOUNTER — Other Ambulatory Visit: Payer: Self-pay

## 2020-05-22 ENCOUNTER — Ambulatory Visit (INDEPENDENT_AMBULATORY_CARE_PROVIDER_SITE_OTHER): Payer: Commercial Managed Care - PPO | Admitting: Diagnostic Neuroimaging

## 2020-05-22 VITALS — BP 122/80 | HR 80 | Ht 66.0 in | Wt 225.0 lb

## 2020-05-22 DIAGNOSIS — M79671 Pain in right foot: Secondary | ICD-10-CM | POA: Diagnosis not present

## 2020-05-22 DIAGNOSIS — R5383 Other fatigue: Secondary | ICD-10-CM

## 2020-05-22 DIAGNOSIS — R4189 Other symptoms and signs involving cognitive functions and awareness: Secondary | ICD-10-CM

## 2020-05-22 DIAGNOSIS — M79672 Pain in left foot: Secondary | ICD-10-CM | POA: Diagnosis not present

## 2020-05-22 NOTE — Progress Notes (Signed)
o

## 2020-05-22 NOTE — Progress Notes (Signed)
JCV blood specimen placed din [facility] lock box for pick up.

## 2020-05-22 NOTE — Progress Notes (Signed)
[facility]  PATIENT: Laurie Travis DOB: 06-Oct-1965  REFERRING CLINICIAN: Eliezer Lofts, MD HISTORY FROM: patient  REASON FOR VISIT: new consult    HISTORICAL  CHIEF COMPLAINT:  Chief Complaint  Patient presents with  . Neuralgia    Rm 7 New Pt "pain, inflammation, thyroid eye disease, suspect MS"    HISTORY OF PRESENT ILLNESS:   55 year old female here for evaluation of malaise, brain fog, weight gain, pain in feet and urinary control issues.  Symptoms started in 2021.  Went to eye doctor for double vision was diagnosed with thyroid eye disease and treated with prednisone in October 2021 with some benefit.  Interestingly her other symptoms also improved while on prednisone.  In 2000 patient was diagnosed with chronic fatigue syndrome.  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  Possibility of MS was raised but she did not have any further follow-up with neurology at that time.   REVIEW OF SYSTEMS: Full 14 system review of systems performed and negative with exception of: As per HPI.  ALLERGIES: Allergies  Allergen Reactions  . Pork-Derived Products Diarrhea  . Sulfa Antibiotics Hives         HOME MEDICATIONS: Outpatient Medications Prior to Visit  Medication Sig Dispense Refill  . B-12, Methylcobalamin, 1000 MCG SUBL Place under the tongue daily.    Marland Kitchen Bioflavonoid Products (ESTER-C) TABS Take by mouth daily.    . Cats Claw, Uncaria tomentosa, (CATS CLAW PO) Take by mouth daily.    . Cholecalciferol (D3 PO) Take 125 mcg by mouth at bedtime.    Marland Kitchen ELDERBERRY PO Take by mouth daily.    . Levothyroxine Sodium 112 MCG CAPS Take by mouth daily before breakfast.    . Magnesium 500 MG CAPS Take by mouth daily.    Marland Kitchen NADH-ASCORBIC ACID-SOD BICARB PO Take by mouth daily.    Marland Kitchen OVER THE COUNTER MEDICATION daily. MONOLAURIN    . UNABLE TO FIND Med Name: Maca root, 500  mg    . UNABLE TO FIND Med Name: NADH 5 mg    . ASHWAGANDHA PO Take by mouth daily.    Marland Kitchen BIOTIN PO Take by mouth daily.    Marland Kitchen ibuprofen (ADVIL,MOTRIN) 600 MG tablet Take 1 tablet (600 mg total) by mouth every 6 (six) hours as needed for moderate pain. 30 tablet 1  . L-FORMULA LYSINE HCL PO Take by mouth daily.    Marland Kitchen l-methylfolate-B6-B12 (METANX) 3-35-2 MG TABS tablet Take 1 tablet by mouth daily. 5-MTHF    . Licorice, Glycyrrhiza glabra, (LICORICE PO) Take by mouth daily.    . Nettle, Urtica Dioica, (NETTLE LEAF PO) Take by mouth daily.    Marland Kitchen OVER THE COUNTER MEDICATION daily. LEMON BALM    . OVER THE COUNTER MEDICATION daily. RED MARINE ALGAE    . oxyCODONE-acetaminophen (ROXICET) 5-325 MG tablet Take 1-2 tablets by mouth every 4 (four) hours as needed for severe pain. (Patient not taking: Reported on 08/18/2019) 15 tablet 0  . RED CLOVER LEAF EXTRACT PO Take by mouth daily.    Marland Kitchen SCHISANDRA PO Take by mouth daily.    . SELENIUM PO Take by mouth daily.    . ST JOHNS WORT PO Take by mouth daily.    . Turmeric (CURCUMIN 95 PO) Take by mouth daily.    . Zinc 100 MG TABS Take by mouth daily.     No facility-administered  medications prior to visit.    PAST MEDICAL HISTORY: Past Medical History:  Diagnosis Date  . Allergy   . Arthritis   . CFS (chronic fatigue syndrome)   . Depression   . GERD (gastroesophageal reflux disease)   . Graves disease   . History of ETT    05/ 2013  per pcp note dr Sharl Ma   subtle changes ST-T waves but low risk for cad  . History of Graves' disease    1999-- dx toxic thyoid nodule s/p  radioactive iodine  . History of palpitations    05/ 2013  normal 24 hour halter monitor  . Hypothyroidism, postradioiodine therapy    1999--  grave's (hyperthyroidism)  . Menorrhagia   . Thyroid eye disease     PAST SURGICAL HISTORY: Past Surgical History:  Procedure Laterality Date  . DILATION AND EVACUATION  2005  . DILITATION & CURRETTAGE/HYSTROSCOPY WITH NOVASURE  ABLATION N/A 01/14/2016   Procedure: DILATATION & CURETTAGE/HYSTEROSCOPY WITH NOVASURE ABLATION;  Surgeon: Zelphia Cairo, MD;  Location: [facility];  Service: Gynecology;  Laterality: N/A;  . TONSILLECTOMY  age 14    FAMILY HISTORY: Family History  Problem Relation Age of Onset  . Heart Problems Father   . CAD Father   . Alzheimer's disease Maternal Grandfather   . Colon polyps Neg Hx   . Colon cancer Neg Hx   . Esophageal cancer Neg Hx   . Rectal cancer Neg Hx   . Stomach cancer Neg Hx     SOCIAL HISTORY: Social History   Socioeconomic History  . Marital status: Single    Spouse name: Not on file  . Number of children: 0  . Years of education: Not on file  . Highest education level: Master's degree (e.g., MA, MS, MEng, MEd, MSW, MBA)  Occupational History  . Not on file  Tobacco Use  . Smoking status: Former Smoker    Years: 16.00    Types: Cigarettes    Quit date: 01/08/2000    Years since quitting: 20.3  . Smokeless tobacco: Never Used  Substance and Sexual Activity  . Alcohol use: Yes    Alcohol/week: 1.0 - 2.0 standard drink    Types: 1 - 2 Standard drinks or equivalent per week    Comment: occasional  . Drug use: No  . Sexual activity: Yes    Birth control/protection: None  Other Topics Concern  . Not on file  Social History Narrative   Lives alone   Caffeine- coffee 1 c daily   Social Determinants of Health   Financial Resource Strain: Not on file  Food Insecurity: Not on file  Transportation Needs: Not on file  Physical Activity: Not on file  Stress: Not on file  Social Connections: Not on file  Intimate Partner Violence: Not on file     PHYSICAL EXAM  GENERAL EXAM/CONSTITUTIONAL: Vitals:  Vitals:   05/22/20 0814  BP: 122/80  Pulse: 80  Weight: 225 lb (102.1 kg)  Height: 5\' 6"  (1.676 m)     Body mass index is 36.32 kg/m. Wt Readings from Last 3 Encounters:  05/22/20 225 lb (102.1 kg)  08/18/19 213 lb (96.6 kg)   07/26/19 213 lb (96.6 kg)     Patient is in no distress; well developed, nourished and groomed; neck is supple  CARDIOVASCULAR:  Examination of carotid arteries is normal; no carotid bruits  Regular rate and rhythm, no murmurs  Examination of peripheral vascular system by observation and palpation is  normal  EYES:  Ophthalmoscopic exam of optic discs and posterior segments is normal; no papilledema or hemorrhages  No exam data present  MUSCULOSKELETAL:  Gait, strength, tone, movements noted in Neurologic exam below  NEUROLOGIC: MENTAL STATUS:  No flowsheet data found.  awake, alert, oriented to person, place and time  recent and remote memory intact  normal attention and concentration  language fluent, comprehension intact, naming intact  fund of knowledge appropriate  CRANIAL NERVE:   2nd - no papilledema on fundoscopic exam  2nd, 3rd, 4th, 6th - pupils equal and reactive to light, visual fields full to confrontation, extraocular muscles intact, no nystagmus; SUBJECTIVE MONOCULAR DOUBLE VISION WITH RIGHT EYE ONLY (VERTICAL)  5th - facial sensation symmetric  7th - facial strength symmetric  8th - hearing intact  9th - palate elevates symmetrically, uvula midline  11th - shoulder shrug symmetric  12th - tongue protrusion midline  MOTOR:   normal bulk and tone, full strength in the BUE, BLE  SENSORY:   normal and symmetric to light touch, temperature, vibration  COORDINATION:   finger-nose-finger, fine finger movements normal  REFLEXES:   deep tendon reflexes present and symmetric  GAIT/STATION:   narrow based gait     DIAGNOSTIC DATA (LABS, IMAGING, TESTING) - I reviewed patient records, labs, notes, testing and imaging myself where available.  Lab Results  Component Value Date   WBC 8.3 01/09/2016   HGB 13.1 01/09/2016   HCT 38.9 01/09/2016   MCV 98.7 01/09/2016   PLT 291 01/09/2016   No results found for: NA, K, CL, CO2,  GLUCOSE, BUN, CREATININE, CALCIUM, PROT, ALBUMIN, AST, ALT, ALKPHOS, BILITOT, GFRNONAA, GFRAA No results found for: CHOL, HDL, LDLCALC, LDLDIRECT, TRIG, CHOLHDL No results found for: DVVO1Y No results found for: VITAMINB12 No results found for: TSH   03/20/20 - 04/03/20 labs [facility]) SPEP - no M-protein Lead - < 1 TTG IgA - < 2 B12 1999 FTAab - non reactive ANA - negative HCV ab - 0.14 (non-reactive) HIV - non-reactive A1c- 5.7 CBC - WBC 10.5, H/H 14.5 / 46.1, PLT 327 CMP - BUN/cr 15/1.2 TSH - 1.408 FT4 - 1.33 T3 total - 68.93 Vit D - 50.7 Ferritin - 48.5  Iron - 126     ASSESSMENT AND PLAN  55 y.o. year old female here with prior transverse myelitis in 2004 at T8-9 level, now with numbness, malaise, fatigue, brain fog, double vision since 2021.  We will proceed with further work-up for MS evaluation  Dx:  1. Pain in both feet   2. Brain fog   3. Other fatigue      PLAN:  - check MRI brain, cervical, thoracic spine (history of T8-9 transverse myelitis in 2004; eval for MS or other autoimmune / inflamm conditions) - check labs  Orders Placed This Encounter  Procedures  . MR BRAIN W WO CONTRAST  . MR CERVICAL SPINE W WO CONTRAST  . MR THORACIC SPINE W WO CONTRAST  . ANA,IFA RA Diag Pnl w/rflx Tit/Patn  . Pan-ANCA  . Angiotensin converting enzyme  . Hep B Surface Antibody  . Hep B Surface Antigen  . Hepatitis B Core AB, Total  . Stratify JCV(TM) Ab w/Index  . QuantiFERON-TB Gold Plus  . Varicella Zoster Antibody, IgG  . HTLV I/II DNA, Qual. Real Time PCR   Return in about 3 months (around 08/20/2020).    Suanne Marker, MD 05/22/2020, 9:01 AM Certified in Neurology, Neurophysiology and Neuroimaging  [facility]  [address] [place], [place] [address] [phone]

## 2020-05-23 LAB — ANGIOTENSIN CONVERTING ENZYME: Angio Convert Enzyme: 52 U/L (ref 14–82)

## 2020-05-24 LAB — STRATIFY JCV(TM) AB W/INDEX

## 2020-05-24 LAB — HEPATITIS B SURFACE ANTIBODY,QUALITATIVE: Hep B Surface Ab, Qual: NONREACTIVE

## 2020-05-25 LAB — PAN-ANCA
Atypical pANCA: <1:20 {titer}
C-ANCA: <1:20 {titer}

## 2020-05-29 ENCOUNTER — Telehealth: Payer: Self-pay | Admitting: Diagnostic Neuroimaging

## 2020-05-29 LAB — STRATIFY JCV(TM) AB W/INDEX: JCV Index Value: 3.39

## 2020-05-29 LAB — QUANTIFERON-TB GOLD PLUS
QuantiFERON Mitogen Value: 1.23 IU/mL
QuantiFERON Nil Value: 0 IU/mL
QuantiFERON TB1 Ag Value: 0.01 IU/mL
QuantiFERON TB2 Ag Value: 0 IU/mL
QuantiFERON-TB Gold Plus: NEGATIVE

## 2020-05-29 LAB — PAN-ANCA
ANCA Proteinase 3: <3.5 U/mL (ref 0.0–3.5)
Myeloperoxidase Ab: <9 U/mL (ref 0.0–9.0)
P-ANCA: <1:20 {titer}

## 2020-05-29 LAB — HEPATITIS B SURFACE ANTIGEN: Hepatitis B Surface Ag: NEGATIVE

## 2020-05-29 LAB — HEPATITIS B CORE ANTIBODY, TOTAL: Hep B Core Total Ab: NEGATIVE

## 2020-05-29 LAB — ANA,IFA RA DIAG PNL W/RFLX TIT/PATN
ANA Titer 1: NEGATIVE
Cyclic Citrullin Peptide Ab: <1 units (ref 0–19)
Rhuematoid fact SerPl-aCnc: <10 IU/mL (ref <14.0)

## 2020-05-29 LAB — VARICELLA ZOSTER ANTIBODY, IGG: Varicella zoster IgG: 3684 index (ref >165)

## 2020-05-29 LAB — HTLV I+II ANTIBODIES, (EIA), BLD: HTLV I/II Ab: NEGATIVE

## 2020-05-29 NOTE — Telephone Encounter (Signed)
no to the covid questions MR Brain w/wo contrast, MR Cervical spine w/wo contrast & MR Thoracic spine w/wo contrast Dr. Donnal Debar auth: NPR Ref # [id number]. Patient is scheduled at [facility] to r1/25/22.

## 2020-06-04 ENCOUNTER — Other Ambulatory Visit: Payer: Self-pay

## 2020-06-04 ENCOUNTER — Other Ambulatory Visit: Payer: Self-pay | Admitting: Diagnostic Neuroimaging

## 2020-06-04 ENCOUNTER — Ambulatory Visit: Payer: Commercial Managed Care - PPO

## 2020-06-04 DIAGNOSIS — M79672 Pain in left foot: Secondary | ICD-10-CM | POA: Diagnosis not present

## 2020-06-04 DIAGNOSIS — M79671 Pain in right foot: Secondary | ICD-10-CM

## 2020-06-04 DIAGNOSIS — R4189 Other symptoms and signs involving cognitive functions and awareness: Secondary | ICD-10-CM | POA: Diagnosis not present

## 2020-06-04 DIAGNOSIS — R5383 Other fatigue: Secondary | ICD-10-CM | POA: Diagnosis not present

## 2020-06-05 ENCOUNTER — Other Ambulatory Visit: Payer: Commercial Managed Care - PPO

## 2020-06-06 ENCOUNTER — Other Ambulatory Visit: Payer: Self-pay | Admitting: *Deleted

## 2020-06-06 ENCOUNTER — Telehealth: Payer: Self-pay | Admitting: Diagnostic Neuroimaging

## 2020-06-06 DIAGNOSIS — R200 Anesthesia of skin: Secondary | ICD-10-CM

## 2020-06-06 DIAGNOSIS — G373 Acute transverse myelitis in demyelinating disease of central nervous system: Secondary | ICD-10-CM

## 2020-06-06 DIAGNOSIS — R9389 Abnormal findings on diagnostic imaging of other specified body structures: Secondary | ICD-10-CM

## 2020-06-06 DIAGNOSIS — N3942 Incontinence without sensory awareness: Secondary | ICD-10-CM

## 2020-06-06 NOTE — Telephone Encounter (Signed)
I called patient with MRI results, which suggest chronic multiple sclerosis.  Will have patient return for postcontrast imaging of brain and cervical spine.  Also noted was a left anterior temporal lesion, which may be related to underlying autoimmune or inflammatory causes, but there is atypical features which raise possibility of neoplastic or hamartomatous etiologies.  Suanne Marker, MD 06/06/2020, 3:44 PM Certified in Neurology, Neurophysiology and Neuroimaging  [facility] [address], [address] [place], [place] [address] [phone]

## 2020-06-07 ENCOUNTER — Ambulatory Visit: Payer: Commercial Managed Care - PPO | Admitting: Neurology

## 2020-06-10 NOTE — Telephone Encounter (Signed)
Patients MRI's with the contrast is scheduled for tomorrow 06/11/20 at [facility].  I did ask her if she wanted xanax she stated no because will not have driver.

## 2020-06-11 ENCOUNTER — Ambulatory Visit (INDEPENDENT_AMBULATORY_CARE_PROVIDER_SITE_OTHER): Payer: Commercial Managed Care - PPO

## 2020-06-11 DIAGNOSIS — G373 Acute transverse myelitis in demyelinating disease of central nervous system: Secondary | ICD-10-CM

## 2020-06-11 DIAGNOSIS — N3942 Incontinence without sensory awareness: Secondary | ICD-10-CM

## 2020-06-11 DIAGNOSIS — R9389 Abnormal findings on diagnostic imaging of other specified body structures: Secondary | ICD-10-CM | POA: Diagnosis not present

## 2020-06-11 DIAGNOSIS — R200 Anesthesia of skin: Secondary | ICD-10-CM | POA: Diagnosis not present

## 2020-06-11 MED ORDER — GADOBENATE DIMEGLUMINE 529 MG/ML IV SOLN
20.0000 mL | Freq: Once | INTRAVENOUS | Status: AC | PRN
  Administered 2020-06-11: 20 mL via INTRAVENOUS

## 2020-06-17 ENCOUNTER — Telehealth: Payer: Self-pay | Admitting: Diagnostic Neuroimaging

## 2020-06-17 NOTE — Telephone Encounter (Signed)
Called patient and informed her that her labs look okay. Her MRI brain has no active lesions. Dr Marjory Lies suspects chronic MS and wants to have appointment to discuss MS medications. FU may be VV. She stated she would prefer VV, so I advised her to set up my chart.   She stated she would set up today. I scheduled her for 06/19/20. Patient verbalized understanding, appreciation.

## 2020-06-17 NOTE — Telephone Encounter (Signed)
Pt is asking to be called as soon as the results to her MRI are available.

## 2020-06-19 ENCOUNTER — Telehealth (INDEPENDENT_AMBULATORY_CARE_PROVIDER_SITE_OTHER): Payer: Commercial Managed Care - PPO | Admitting: Diagnostic Neuroimaging

## 2020-06-19 ENCOUNTER — Telehealth: Payer: Self-pay | Admitting: *Deleted

## 2020-06-19 ENCOUNTER — Encounter: Payer: Self-pay | Admitting: *Deleted

## 2020-06-19 DIAGNOSIS — G35 Multiple sclerosis: Secondary | ICD-10-CM | POA: Diagnosis not present

## 2020-06-19 NOTE — Telephone Encounter (Addendum)
Per Dr Marjory Lies called patient and informed her that she can get Solumedrol infusions in our office next Mon,Tues Wed. Liane RN will call her tomorrow to schedule infusions; she stated to call after 12 noon. I advised while she is here Mon I will have her sign Vumerity start form. She verbalized understanding, appreciation. Solumedrol orders signed, placed on Liane RN's desk.

## 2020-06-19 NOTE — Progress Notes (Signed)
[facility]  PATIENT: Laurie Travis DOB: [birth year]  REFERRING CLINICIAN: Eliezer Lofts, MD HISTORY FROM: patient  REASON FOR VISIT: follow up   HISTORICAL  CHIEF COMPLAINT:  Chief Complaint  Patient presents with  . Multiple Sclerosis    HISTORY OF PRESENT ILLNESS:   UPDATE (06/19/20, VRP): Since last visit, continues to have diffuse pain and discomfort, mainly in her heels and feet. Symptoms are moderate to severe. No alleviating or aggravating factors. Tolerating supplements.  MRI is completed and confirmed diagnosis of multiple sclerosis.  Also found to have incidental left temporal arachnoid cyst.  No abnormal enhancing lesions were noted.  Patient interested to start disease modifying therapy, but prefers oral agent if possible.  PRIOR HPI 55 year old female here for evaluation of malaise, brain fog, weight gain, pain in feet and urinary control issues.  Symptoms started in 2021.  Went to eye doctor for double vision was diagnosed with thyroid eye disease and treated with prednisone in October 2021 with some benefit.  Interestingly her other symptoms also improved while on prednisone.  In 2000 patient was diagnosed with chronic fatigue syndrome.  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  Possibility of MS was raised but she did not have any further follow-up with neurology at that time.   REVIEW OF SYSTEMS: Full 14 system review of systems performed and negative with exception of: As per HPI.  ALLERGIES: Allergies  Allergen Reactions  . Pork-Derived Products Diarrhea  . Sulfa Antibiotics Hives         HOME MEDICATIONS: Outpatient Medications Prior to Visit  Medication Sig Dispense Refill  . B-12, Methylcobalamin, 1000 MCG SUBL Place under the tongue daily.    Marland Kitchen Bioflavonoid Products (ESTER-C) TABS Take by mouth daily.    . Cats Claw, Uncaria tomentosa,  (CATS CLAW PO) Take by mouth daily.    . Cholecalciferol (D3 PO) Take 125 mcg by mouth at bedtime.    Marland Kitchen ELDERBERRY PO Take by mouth daily.    . Levothyroxine Sodium 112 MCG CAPS Take by mouth daily before breakfast.    . Magnesium 500 MG CAPS Take by mouth daily.    Marland Kitchen NADH-ASCORBIC ACID-SOD BICARB PO Take by mouth daily.    Marland Kitchen OVER THE COUNTER MEDICATION daily. MONOLAURIN    . UNABLE TO FIND Med Name: Maca root, 500 mg     No facility-administered medications prior to visit.    PAST MEDICAL HISTORY: Past Medical History:  Diagnosis Date  . Allergy   . Arthritis   . CFS (chronic fatigue syndrome)   . Depression   . GERD (gastroesophageal reflux disease)   . Graves disease   . History of ETT    05/ 2013  per pcp note dr Sharl Ma   subtle changes ST-T waves but low risk for cad  . History of Graves' disease    1999-- dx toxic thyoid nodule s/p  radioactive iodine  . History of palpitations    05/ 2013  normal 24 hour halter monitor  . Hypothyroidism, postradioiodine therapy    1999--  grave's (hyperthyroidism)  . Menorrhagia   . Thyroid eye disease     PAST SURGICAL HISTORY: Past Surgical History:  Procedure Laterality Date  . DILATION AND EVACUATION  2005  . DILITATION & CURRETTAGE/HYSTROSCOPY WITH NOVASURE ABLATION N/A 01/14/2016   Procedure: DILATATION & CURETTAGE/HYSTEROSCOPY WITH NOVASURE ABLATION;  Surgeon: Zelphia Cairo, MD;  Location:  [facility];  Service: Gynecology;  Laterality: N/A;  . TONSILLECTOMY  age 3    FAMILY HISTORY: Family History  Problem Relation Age of Onset  . Heart Problems Father   . CAD Father   . Alzheimer's disease Maternal Grandfather   . Colon polyps Neg Hx   . Colon cancer Neg Hx   . Esophageal cancer Neg Hx   . Rectal cancer Neg Hx   . Stomach cancer Neg Hx     SOCIAL HISTORY: Social History   Socioeconomic History  . Marital status: Single    Spouse name: Not on file  . Number of children: 0  . Years of  education: Not on file  . Highest education level: Master's degree (e.g., MA, MS, MEng, MEd, MSW, MBA)  Occupational History  . Not on file  Tobacco Use  . Smoking status: Former Smoker    Years: 16.00    Types: Cigarettes    Quit date: 01/08/2000    Years since quitting: 20.4  . Smokeless tobacco: Never Used  Substance and Sexual Activity  . Alcohol use: Yes    Alcohol/week: 1.0 - 2.0 standard drink    Types: 1 - 2 Standard drinks or equivalent per week    Comment: occasional  . Drug use: No  . Sexual activity: Yes    Birth control/protection: None  Other Topics Concern  . Not on file  Social History Narrative   Lives alone   Caffeine- coffee 1 c daily   Social Determinants of Health   Financial Resource Strain: Not on file  Food Insecurity: Not on file  Transportation Needs: Not on file  Physical Activity: Not on file  Stress: Not on file  Social Connections: Not on file  Intimate Partner Violence: Not on file     PHYSICAL EXAM  Video visit    DIAGNOSTIC DATA (LABS, IMAGING, TESTING) - I reviewed patient records, labs, notes, testing and imaging myself where available.  Lab Results  Component Value Date   WBC 8.3 01/09/2016   HGB 13.1 01/09/2016   HCT 38.9 01/09/2016   MCV 98.7 01/09/2016   PLT 291 01/09/2016   No results found for: NA, K, CL, CO2, GLUCOSE, BUN, CREATININE, CALCIUM, PROT, ALBUMIN, AST, ALT, ALKPHOS, BILITOT, GFRNONAA, GFRAA No results found for: CHOL, HDL, LDLCALC, LDLDIRECT, TRIG, CHOLHDL No results found for: IWPY0D No results found for: VITAMINB12 No results found for: TSH   03/20/20 - 04/03/20 labs [facility]) SPEP - no M-protein Lead - < 1 TTG IgA - < 2 B12 1999 FTAab - non reactive ANA - negative HCV ab - 0.14 (non-reactive) HIV - non-reactive A1c- 5.7 CBC - WBC 10.5, H/H 14.5 / 46.1, PLT 327 CMP - BUN/cr 15/1.2 TSH - 1.408 FT4 - 1.33 T3 total - 68.93 Vit D - 50.7 Ferritin - 48.5  Iron - 126  05/22/20 [facility]  labs - HTLV, VZV, HepB, HepC, ANCA, ANA, TB - all negative    JCV Index Value 3.39   JCV Antibody PositiveAbnormal     06/04/20 MRI brain without contrast demonstrating: -Multiple scattered periventricular subcortical, pericallosal T2 hyperintensities. These findings are non-specific and considerations include autoimmune, inflammatory, post-infectious, microvascular ischemic or migraine associated etiologies.  -Left anterior temporal T2 flair hyperintense lesion may represent autoimmune, inflammatory or neoplastic process.    06/04/20 MRI cervical spine without contrast demonstrating: -Subtle intrinsic spinal cord T2 STIR hyperintensities at C2-3 on the right and C3-4 on the left. May be seen with autoimmune,  inflammatory, demyelinating or postinfectious conditions.   06/11/20 MRI brain w - No abnormal lesions on postcontrast views. - Stable left anterior temporal cystic lesion without enhancement.  Most likely represents arachnoid cyst.   06/11/20 MRI cervical spine w -No abnormal enhancing spinal cord lesions.  Previously noted T2 hyperintense spinal cord lesions are not visualized on this dedicated postcontrast study.    ASSESSMENT AND PLAN  55 y.o. year old female here with prior transverse myelitis in 2004 at T8-9 level, now with numbness, malaise, fatigue, brain fog, double vision since 2021.     Dx:  1. Relapsing remitting multiple sclerosis (HCC)     PLAN:  RRMS (relapsing remitting multiple sclerosis; first attack 2004 with transverse myelitis, then recurrent symptoms in 2021) - start vumerity - start IV solumedrol 1000mg  IV x 3 days (patient prefers at home IV)  Follow Up Instructions:  - Return in about 4 months (around 10/17/2020).     Virtual Visit via Video Note  I connected with 12/17/2020 on 06/19/20 at  3:30 PM EST by a video enabled telemedicine application and verified that I am speaking with the correct person using two identifiers.   I discussed the  limitations of evaluation and management by telemedicine and the availability of in person appointments. The patient expressed understanding and agreed to proceed.  Patient is at their home. I am at the office.   I discussed the assessment and treatment plan with the patient. The patient was provided an opportunity to ask questions and all were answered. The patient agreed with the plan and demonstrated an understanding of the instructions.   The patient was advised to call back or seek an in-person evaluation if the symptoms worsen or if the condition fails to improve as anticipated.  I provided 25 minutes of non-face-to-face time during this encounter.     Return in about 4 months (around 10/17/2020).    12/17/2020, MD 06/19/2020, 3:35 PM Certified in Neurology, Neurophysiology and Neuroimaging  [facility] [address] [place], [place] [phone]

## 2020-06-24 NOTE — Telephone Encounter (Signed)
Vumerity start form completed, signed and faxed to [employer]. 

## 2020-06-24 NOTE — Telephone Encounter (Signed)
Patient in infusion suite for Solu Medrol infusion. Vumerity start form signed, placed on MD desk for completion, signature.

## 2020-06-26 NOTE — Telephone Encounter (Signed)
Received fax from [employer]: Vumerity start form received, questions [phone] . They will provide quick start Rx through [facility] while waiting on her insurance approval. We will receive fax once she has received Vumerity from her insurance specialty pharmacy. [facility] ph [phone], f [phone].

## 2020-07-03 ENCOUNTER — Telehealth: Payer: Self-pay | Admitting: *Deleted

## 2020-07-03 ENCOUNTER — Encounter: Payer: Self-pay | Admitting: *Deleted

## 2020-07-03 NOTE — Telephone Encounter (Signed)
Vumerity maintenance dose PA, key: [id number]. Received this message: This medication or product was previously approved on [id number] from 2020-07-03 to 2021-07-03.

## 2020-07-03 NOTE — Telephone Encounter (Addendum)
[employer] [facility] approved Vumerity starter pack until 07/03/2021. PA [id number], member ID: [id number]. Faxed approval letter to [employer]. Sent patient a my chart to advise.

## 2020-07-03 NOTE — Telephone Encounter (Signed)
Vumerity PA, key: [id number], G35. PA started for titration pack, #106 tabs for 30 days.  Information sent to [facility].

## 2020-07-08 NOTE — Telephone Encounter (Signed)
Noted  

## 2020-07-22 ENCOUNTER — Other Ambulatory Visit: Payer: Self-pay

## 2020-07-22 ENCOUNTER — Ambulatory Visit (INDEPENDENT_AMBULATORY_CARE_PROVIDER_SITE_OTHER): Payer: Commercial Managed Care - PPO

## 2020-07-22 ENCOUNTER — Ambulatory Visit (INDEPENDENT_AMBULATORY_CARE_PROVIDER_SITE_OTHER): Payer: Commercial Managed Care - PPO | Admitting: Podiatry

## 2020-07-22 DIAGNOSIS — M79671 Pain in right foot: Secondary | ICD-10-CM | POA: Diagnosis not present

## 2020-07-22 DIAGNOSIS — M79672 Pain in left foot: Secondary | ICD-10-CM

## 2020-07-22 DIAGNOSIS — M722 Plantar fascial fibromatosis: Secondary | ICD-10-CM

## 2020-07-22 MED ORDER — TRIAMCINOLONE ACETONIDE 10 MG/ML IJ SUSP
10.0000 mg | Freq: Once | INTRAMUSCULAR | Status: AC
  Administered 2020-07-22: 10 mg

## 2020-07-22 NOTE — Patient Instructions (Signed)

## 2020-07-24 NOTE — Progress Notes (Signed)
Subjective:   Patient ID: Laurie Travis, female   DOB: 55 y.o.   MRN: [id number]   HPI Patient presents stating she has a lot of pain in the bottom of both heels and its been bothering her for several months.  States she does not remember specific injury and states it is worse when she gets up in the morning after periods of sitting   ROS      Objective:  Physical Exam  Neuro vascular status intact with exquisite discomfort plantar heel region bilateral with inflammation fluid at the medial band at insertion calcaneus bilateral     Assessment:  Acute plantar fasciitis present bilateral with moderate depression of the arch noted     Plan:  H&P reviewed condition and today we will get a focus on the acute nature of condition.  I injected the plantar fascial bilateral 3 mg Kenalog 5 mg Xylocaine and went ahead and applied fascial brace bilateral with instructions on usage.  Patient will be seen back to recheck 2 weeks may require orthotics or other treatment  X-rays indicate moderate depression of the arch spur formation bilateral

## 2020-08-05 ENCOUNTER — Ambulatory Visit (INDEPENDENT_AMBULATORY_CARE_PROVIDER_SITE_OTHER): Payer: Commercial Managed Care - PPO | Admitting: Podiatry

## 2020-08-05 ENCOUNTER — Other Ambulatory Visit: Payer: Self-pay

## 2020-08-05 ENCOUNTER — Encounter: Payer: Self-pay | Admitting: Podiatry

## 2020-08-05 DIAGNOSIS — M722 Plantar fascial fibromatosis: Secondary | ICD-10-CM | POA: Diagnosis not present

## 2020-08-07 NOTE — Progress Notes (Signed)
Subjective:   Patient ID: Laurie Travis, female   DOB: 55 y.o.   MRN: [id number]   HPI Patient states that she is improving quite a bit with discomfort only noted upon deep palpation at this time   ROS      Objective:  Physical Exam  Neurovascular status intact with significant reduction of plantar pain with pain still present upon deep palpation but overall improved     Assessment:  Fasciitis-like symptomatology improved     Plan:  Recommended the continuation of anti-inflammatories support therapy and ice therapy.  I advised her that this may require more aggressive treatment if symptoms were to persist

## 2020-08-20 ENCOUNTER — Encounter: Payer: Self-pay | Admitting: Diagnostic Neuroimaging

## 2020-08-20 ENCOUNTER — Ambulatory Visit (INDEPENDENT_AMBULATORY_CARE_PROVIDER_SITE_OTHER): Payer: Commercial Managed Care - PPO | Admitting: Diagnostic Neuroimaging

## 2020-08-20 VITALS — BP 112/82 | HR 81 | Ht 66.0 in | Wt 226.6 lb

## 2020-08-20 DIAGNOSIS — M79671 Pain in right foot: Secondary | ICD-10-CM

## 2020-08-20 DIAGNOSIS — R5383 Other fatigue: Secondary | ICD-10-CM | POA: Diagnosis not present

## 2020-08-20 DIAGNOSIS — R4189 Other symptoms and signs involving cognitive functions and awareness: Secondary | ICD-10-CM

## 2020-08-20 DIAGNOSIS — G35 Multiple sclerosis: Secondary | ICD-10-CM | POA: Diagnosis not present

## 2020-08-20 DIAGNOSIS — G8929 Other chronic pain: Secondary | ICD-10-CM

## 2020-08-20 DIAGNOSIS — M5441 Lumbago with sciatica, right side: Secondary | ICD-10-CM | POA: Diagnosis not present

## 2020-08-20 DIAGNOSIS — M79672 Pain in left foot: Secondary | ICD-10-CM

## 2020-08-20 NOTE — Patient Instructions (Signed)
  RRMS (relapsing remitting multiple sclerosis; first attack 2004 with transverse myelitis, then recurrent symptoms in 2021) - continue vumerity; check labs   FATIGUE - consider modafinil - consider sleep study   LOW BACK PAIN (radiating to right leg) - refer to PT; may consider MRI lumbar spine in future

## 2020-08-20 NOTE — Progress Notes (Signed)
[facility]  PATIENT: Laurie Travis DOB: [birth year]  REFERRING CLINICIAN: Eliezer Lofts, MD HISTORY FROM: patient  REASON FOR VISIT: follow up   HISTORICAL  CHIEF COMPLAINT:  Chief Complaint  Patient presents with  . Multiple Sclerosis    Rm 7, 3 month FU  "hair loss from Vumerity; issues returning on whole right side of my body with pain, weakness beginning a week ago, getting progressively worse and I think it comes from my lower back; I am willing to try PT first before switching drug therapy; may have slight foot drop on right side; feet a lot better s/p shots, wearing ankle braces"      HISTORY OF PRESENT ILLNESS:   UPDATE (08/20/20, VRP): Since last visit, doing well. Symptoms are stable.  Having some issues with right-sided low back pain rating to the leg, increasing dreams, neck pain, right toe tripping, fatigue issues.  Tolerating Vumerity but still having some flushing and GI symptoms.  No alleviating or aggravating factors.    UPDATE (06/19/20, VRP): Since last visit, continues to have diffuse pain and discomfort, mainly in her heels and feet. Symptoms are moderate to severe. No alleviating or aggravating factors. Tolerating supplements.  MRI is completed and confirmed diagnosis of multiple sclerosis.  Also found to have incidental left temporal arachnoid cyst.  No abnormal enhancing lesions were noted.  Patient interested to start disease modifying therapy, but prefers oral agent if possible.  PRIOR HPI 55 year old female here for evaluation of malaise, brain fog, weight gain, pain in feet and urinary control issues.  Symptoms started in 2021.  Went to eye doctor for double vision was diagnosed with thyroid eye disease and treated with prednisone in October 2021 with some benefit.  Interestingly her other symptoms also improved while on prednisone.  In 2000 patient was diagnosed with chronic fatigue syndrome.  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  Possibility of MS was raised but she did not have any further follow-up with neurology at that time.   REVIEW OF SYSTEMS: Full 14 system review of systems performed and negative with exception of: As per HPI.  ALLERGIES: Allergies  Allergen Reactions  . Pork-Derived Products Diarrhea  . Sulfa Antibiotics Hives         HOME MEDICATIONS: Outpatient Medications Prior to Visit  Medication Sig Dispense Refill  . B-12, Methylcobalamin, 1000 MCG SUBL Place under the tongue daily.    Marland Kitchen Bioflavonoid Products (ESTER-C) TABS Take by mouth daily.    . Cats Claw, Uncaria tomentosa, (CATS CLAW PO) Take by mouth daily.    . Cholecalciferol (D3 PO) Take 125 mcg by mouth at bedtime.    . Diroximel Fumarate (VUMERITY) 231 MG CPDR Take 462 mg by mouth 2 (two) times daily.    Marland Kitchen ELDERBERRY PO Take by mouth daily.    . Levothyroxine Sodium 112 MCG CAPS Take by mouth daily before breakfast.    . Magnesium 500 MG CAPS Take by mouth daily.    Marland Kitchen NADH-ASCORBIC ACID-SOD BICARB PO Take by mouth daily.    Marland Kitchen OVER THE COUNTER MEDICATION daily. MONOLAURIN    . UNABLE TO FIND Med Name: Maca root, 500 mg     No facility-administered medications prior to visit.    PAST MEDICAL HISTORY: Past Medical History:  Diagnosis Date  . Allergy   . Arthritis   . CFS (chronic fatigue syndrome)   .  Depression   . GERD (gastroesophageal reflux disease)   . Graves disease   . History of ETT    05/ 2013  per pcp note dr Sharl Ma   subtle changes ST-T waves but low risk for cad  . History of Graves' disease    1999-- dx toxic thyoid nodule s/p  radioactive iodine  . History of palpitations    05/ 2013  normal 24 hour halter monitor  . Hypothyroidism, postradioiodine therapy    1999--  grave's (hyperthyroidism)  . Menorrhagia   . Thyroid eye disease     PAST SURGICAL HISTORY: Past Surgical History:  Procedure Laterality Date  .  DILATION AND EVACUATION  2005  . DILITATION & CURRETTAGE/HYSTROSCOPY WITH NOVASURE ABLATION N/A 01/14/2016   Procedure: DILATATION & CURETTAGE/HYSTEROSCOPY WITH NOVASURE ABLATION;  Surgeon: Zelphia Cairo, MD;  Location: [facility];  Service: Gynecology;  Laterality: N/A;  . TONSILLECTOMY  age 76    FAMILY HISTORY: Family History  Problem Relation Age of Onset  . Heart Problems Father   . CAD Father   . Alzheimer's disease Maternal Grandfather   . Colon polyps Neg Hx   . Colon cancer Neg Hx   . Esophageal cancer Neg Hx   . Rectal cancer Neg Hx   . Stomach cancer Neg Hx     SOCIAL HISTORY: Social History   Socioeconomic History  . Marital status: Single    Spouse name: Not on file  . Number of children: 0  . Years of education: Not on file  . Highest education level: Master's degree (e.g., MA, MS, MEng, MEd, MSW, MBA)  Occupational History  . Not on file  Tobacco Use  . Smoking status: Former Smoker    Years: 16.00    Types: Cigarettes    Quit date: 01/08/2000    Years since quitting: 20.6  . Smokeless tobacco: Never Used  Substance and Sexual Activity  . Alcohol use: Yes    Alcohol/week: 1.0 - 2.0 standard drink    Types: 1 - 2 Standard drinks or equivalent per week    Comment: occasional  . Drug use: No  . Sexual activity: Yes    Birth control/protection: None  Other Topics Concern  . Not on file  Social History Narrative   Lives alone   Caffeine- coffee 1 c daily   Social Determinants of Health   Financial Resource Strain: Not on file  Food Insecurity: Not on file  Transportation Needs: Not on file  Physical Activity: Not on file  Stress: Not on file  Social Connections: Not on file  Intimate Partner Violence: Not on file     PHYSICAL EXAM  GENERAL EXAM/CONSTITUTIONAL: Vitals:  Vitals:   08/20/20 0825  BP: 112/82  Pulse: 81  Weight: 226 lb 9.6 oz (102.8 kg)  Height: 5\' 6"  (1.676 m)   Body mass index is 36.57 kg/m. Wt Readings  from Last 3 Encounters:  08/20/20 226 lb 9.6 oz (102.8 kg)  05/22/20 225 lb (102.1 kg)  08/18/19 213 lb (96.6 kg)    Patient is in no distress; well developed, nourished and groomed; neck is supple  CARDIOVASCULAR:  Examination of carotid arteries is normal; no carotid bruits  Regular rate and rhythm, no murmurs  Examination of peripheral vascular system by observation and palpation is normal  EYES:  Ophthalmoscopic exam of optic discs and posterior segments is normal; no papilledema or hemorrhages No exam data present  MUSCULOSKELETAL:  Gait, strength, tone, movements noted in Neurologic  exam below  NEUROLOGIC: MENTAL STATUS:  No flowsheet data found.  awake, alert, oriented to person, place and time  recent and remote memory intact  normal attention and concentration  language fluent, comprehension intact, naming intact  fund of knowledge appropriate  CRANIAL NERVE:   2nd - no papilledema on fundoscopic exam  2nd, 3rd, 4th, 6th - pupils equal and reactive to light, visual fields full to confrontation, extraocular muscles intact, no nystagmus  5th - facial sensation symmetric  7th - facial strength symmetric  8th - hearing intact  9th - palate elevates symmetrically, uvula midline  11th - shoulder shrug symmetric  12th - tongue protrusion midline  MOTOR:   normal bulk and tone, full strength in the BUE, BLE  SENSORY:   normal and symmetric to light touch, temperature, vibration  COORDINATION:   finger-nose-finger, fine finger movements normal  REFLEXES:   deep tendon reflexes present and symmetric  GAIT/STATION:   narrow based gait     DIAGNOSTIC DATA (LABS, IMAGING, TESTING) - I reviewed patient records, labs, notes, testing and imaging myself where available.  Lab Results  Component Value Date   WBC 8.3 01/09/2016   HGB 13.1 01/09/2016   HCT 38.9 01/09/2016   MCV 98.7 01/09/2016   PLT 291 01/09/2016   No results found for:  NA, K, CL, CO2, GLUCOSE, BUN, CREATININE, CALCIUM, PROT, ALBUMIN, AST, ALT, ALKPHOS, BILITOT, GFRNONAA, GFRAA No results found for: CHOL, HDL, LDLCALC, LDLDIRECT, TRIG, CHOLHDL No results found for: ZOXW9U No results found for: VITAMINB12 No results found for: TSH   03/20/20 - 04/03/20 labs [facility]) SPEP - no M-protein Lead - < 1 TTG IgA - < 2 B12 1999 FTAab - non reactive ANA - negative HCV ab - 0.14 (non-reactive) HIV - non-reactive A1c- 5.7 CBC - WBC 10.5, H/H 14.5 / 46.1, PLT 327 CMP - BUN/cr 15/1.2 TSH - 1.408 FT4 - 1.33 T3 total - 68.93 Vit D - 50.7 Ferritin - 48.5  Iron - 126  05/22/20 [facility] labs - HTLV, VZV, HepB, HepC, ANCA, ANA, TB - all negative    JCV Index Value 3.39   JCV Antibody PositiveAbnormal     06/04/20 MRI brain without contrast demonstrating: -Multiple scattered periventricular subcortical, pericallosal T2 hyperintensities. These findings are non-specific and considerations include autoimmune, inflammatory, post-infectious, microvascular ischemic or migraine associated etiologies.  -Left anterior temporal T2 flair hyperintense lesion may represent autoimmune, inflammatory or neoplastic process.    06/04/20 MRI cervical spine without contrast demonstrating: -Subtle intrinsic spinal cord T2 STIR hyperintensities at C2-3 on the right and C3-4 on the left. May be seen with autoimmune, inflammatory, demyelinating or postinfectious conditions.   06/11/20 MRI brain w - No abnormal lesions on postcontrast views. - Stable left anterior temporal cystic lesion without enhancement.  Most likely represents arachnoid cyst.   06/11/20 MRI cervical spine w -No abnormal enhancing spinal cord lesions.  Previously noted T2 hyperintense spinal cord lesions are not visualized on this dedicated postcontrast study.    ASSESSMENT AND PLAN  55 y.o. year old female here with prior transverse myelitis in 2004 at T8-9 level, now with numbness, malaise, fatigue,  brain fog, double vision since 2021.     Dx:  1. Chronic right-sided low back pain with right-sided sciatica   2. Relapsing remitting multiple sclerosis (HCC)   3. Brain fog   4. Other fatigue   5. Pain in both feet      PLAN:  RRMS (relapsing remitting multiple sclerosis; first attack  2004 with transverse myelitis, then recurrent symptoms in 2021) - continue vumerity; check labs  FATIGUE - consider modafinil - consider sleep study   LOW BACK PAIN (radiating to right leg) - refer to PT; may consider MRI lumbar spine in future   Orders Placed This Encounter  Procedures  . CBC with Differential/Platelet  . Comprehensive metabolic panel  . Ambulatory referral to Physical Therapy     Follow Up Instructions:  - Return in about 6 months (around 02/19/2021).     Suanne Marker, MD 08/20/2020, 9:17 AM Certified in Neurology, Neurophysiology and Neuroimaging  [facility] [address] [place], [place] [address] [phone]

## 2020-08-21 LAB — COMPREHENSIVE METABOLIC PANEL
ALT: 20 IU/L (ref 0–32)
AST: 16 IU/L (ref 0–40)
Albumin/Globulin Ratio: 1.9 (ref 1.2–2.2)
Albumin: 4.6 g/dL (ref 3.8–4.9)
Alkaline Phosphatase: 98 IU/L (ref 44–121)
BUN/Creatinine Ratio: 10 (ref 9–23)
BUN: 11 mg/dL (ref 6–24)
Bilirubin Total: 0.2 mg/dL (ref 0.0–1.2)
CO2: 24 mmol/L (ref 20–29)
Calcium: 10.4 mg/dL — ABNORMAL HIGH (ref 8.7–10.2)
Chloride: 101 mmol/L (ref 96–106)
Creatinine, Ser: 1.15 mg/dL — ABNORMAL HIGH (ref 0.57–1.00)
Globulin, Total: 2.4 g/dL (ref 1.5–4.5)
Glucose: 92 mg/dL (ref 65–99)
Potassium: 4.9 mmol/L (ref 3.5–5.2)
Sodium: 141 mmol/L (ref 134–144)
Total Protein: 7 g/dL (ref 6.0–8.5)
eGFR: 57 mL/min/{1.73_m2} — ABNORMAL LOW (ref >59)

## 2020-08-21 LAB — CBC WITH DIFFERENTIAL/PLATELET
Basophils Absolute: 0 10*3/uL (ref 0.0–0.2)
Basos: 1 %
EOS (ABSOLUTE): 0.3 10*3/uL (ref 0.0–0.4)
Eos: 5 %
Hematocrit: 41.2 % (ref 34.0–46.6)
Hemoglobin: 13.8 g/dL (ref 11.1–15.9)
Immature Grans (Abs): 0 10*3/uL (ref 0.0–0.1)
Immature Granulocytes: 0 %
Lymphocytes Absolute: 1.3 10*3/uL (ref 0.7–3.1)
Lymphs: 22 %
MCH: 33.1 pg — ABNORMAL HIGH (ref 26.6–33.0)
MCHC: 33.5 g/dL (ref 31.5–35.7)
MCV: 99 fL — ABNORMAL HIGH (ref 79–97)
Monocytes Absolute: 0.7 10*3/uL (ref 0.1–0.9)
Monocytes: 12 %
Neutrophils Absolute: 3.4 10*3/uL (ref 1.4–7.0)
Neutrophils: 60 %
Platelets: 298 10*3/uL (ref 150–450)
RBC: 4.17 x10E6/uL (ref 3.77–5.28)
RDW: 13 % (ref 11.7–15.4)
WBC: 5.7 10*3/uL (ref 3.4–10.8)

## 2020-09-03 ENCOUNTER — Other Ambulatory Visit: Payer: Self-pay

## 2020-09-03 ENCOUNTER — Ambulatory Visit: Payer: Commercial Managed Care - PPO | Attending: Diagnostic Neuroimaging

## 2020-09-03 DIAGNOSIS — M6281 Muscle weakness (generalized): Secondary | ICD-10-CM | POA: Insufficient documentation

## 2020-09-03 DIAGNOSIS — R2681 Unsteadiness on feet: Secondary | ICD-10-CM | POA: Diagnosis not present

## 2020-09-03 DIAGNOSIS — M5417 Radiculopathy, lumbosacral region: Secondary | ICD-10-CM | POA: Diagnosis present

## 2020-09-03 NOTE — Patient Instructions (Signed)
Access Code: [email or web address] URL: [email or web address] Date: 09/03/2020 Prepared by: Gustavus Bryant  Exercises Supine Bridge - 2 x daily - 7 x weekly - 2 sets - 10 reps Bent Knee Fallouts - 2 x daily - 7 x weekly - 2 sets - 10 reps Curl Up with Arms Crossed - 2 x daily - 7 x weekly - 2 sets - 10 reps

## 2020-09-03 NOTE — Therapy (Signed)
[facility] [address] [place], [place], [address] Phone: [phone]   Fax:  [phone]  Physical Therapy Evaluation  Patient Details  Name: Laurie Travis MRN: [id number] Date of Birth: Sep 28, 1965 Referring Provider (PT): Joycelyn Schmid MD   Encounter Date: 09/03/2020   PT End of Session - 09/03/20 1539    Visit Number 1    Number of Visits 9    Date for PT Re-Evaluation 11/05/20    Authorization Type UHC    PT Start Time 1530    PT Stop Time 1615    PT Time Calculation (min) 45 min    Equipment Utilized During Treatment Gait belt    Activity Tolerance Patient tolerated treatment well    Behavior During Therapy [facility] for tasks assessed/performed           Past Medical History:  Diagnosis Date  . Allergy   . Arthritis   . CFS (chronic fatigue syndrome)   . Depression   . GERD (gastroesophageal reflux disease)   . Graves disease   . History of ETT    05/ 2013  per pcp note dr Sharl Ma   subtle changes ST-T waves but low risk for cad  . History of Graves' disease    1999-- dx toxic thyoid nodule s/p  radioactive iodine  . History of palpitations    05/ 2013  normal 24 hour halter monitor  . Hypothyroidism, postradioiodine therapy    1999--  grave's (hyperthyroidism)  . Menorrhagia   . Thyroid eye disease     Past Surgical History:  Procedure Laterality Date  . DILATION AND EVACUATION  2005  . DILITATION & CURRETTAGE/HYSTROSCOPY WITH NOVASURE ABLATION N/A 01/14/2016   Procedure: DILATATION & CURETTAGE/HYSTEROSCOPY WITH NOVASURE ABLATION;  Surgeon: Zelphia Cairo, MD;  Location: [facility];  Service: Gynecology;  Laterality: N/A;  . TONSILLECTOMY  age 55    There were no vitals filed for this visit.    Subjective Assessment - 09/03/20 1533    Subjective Reports a history of R lumbosacral pain, denies pain past knee or trueradiating symptoms, ongoing 6-8 weeks following completion of IV  steroids, has been on steroidals several times due to disease process    Limitations Sitting;Standing;Walking    How long can you sit comfortably? unrestricted    How long can you stand comfortably? 15 min    How long can you walk comfortably? 30 min    Currently in Pain? Yes    Pain Score 6     Pain Location Sacrum    Pain Orientation Right    Pain Descriptors / Indicators Aching    Pain Type Chronic pain    Pain Onset More than a month ago    Pain Frequency Several days a week    Aggravating Factors  unweighted posiitons              [facility] PT Assessment - 09/03/20 0001      Assessment   Medical Diagnosis Sciatica    Referring Provider (PT) Joycelyn Schmid MD    Onset Date/Surgical Date 08/20/20    Next MD Visit 6 mos      Precautions   Precautions Fall      Balance Screen   Has the patient fallen in the past 6 months No      Home Environment   Living Environment Private residence    Living Arrangements Alone    Additional Comments stairs      Prior  Function   Level of Independence Independent    Vocation Full time employment    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 are Fluid and Coordinated Yes    Fine Motor Movements are Fluid and Coordinated Yes      ROM / Strength   AROM / PROM / Strength AROM      AROM   Overall AROM  Within functional limits for tasks performed      Strength   Overall Strength Within functional limits for tasks performed      Palpation   SI assessment  tender to R SI region      Transfers   Comments I in all transfers      Ambulation/Gait   Ambulation/Gait Yes    Gait Comments no deficits observed            Patient demonstrates a posterior rotation of her L ilium           Objective measurements completed on examination: See above findings.               PT Education - 09/03/20 1617    Education Details  [email or web address]    Person(s) Educated Patient    Methods Explanation;Demonstration;Handout    Comprehension Verbalized understanding;Returned demonstration;Verbal cues required;Tactile cues required;Need further instruction            PT Short Term Goals - 09/03/20 1629      PT SHORT TERM GOAL #1   Title STGs=LTGs             PT Long Term Goals - 09/03/20 1629      PT LONG TERM GOAL #1   Title Patient to demo finalized HEP back to PT /wo need of cuing    Baseline Inital HEP issued    Time 8    Period Weeks    Status New    Target Date 11/05/20      PT LONG TERM GOAL #2   Title Decrease pain from 6/10 to 2/10 in R lumbosacral region    Baseline 6/10 pain    Time 8    Period Weeks    Target Date 11/05/20      PT LONG TERM GOAL #3   Title Patient to demo symmetric pelvic alignment    Baseline Posterior rotation of L ilium    Time 8    Period Weeks    Status New    Target Date 11/05/20      PT LONG TERM GOAL #4   Title Assess 5x STS score with goal of <15 sec    Baseline TBD    Time 8    Period Weeks    Status New    Target Date 11/05/20                  Plan - 09/03/20 1618    Clinical Impression Statement Patient presents with R lumbosacral pain w/o radicular symptoms, symptoms onset 6-8 weeks ago co-inciding with an IV steroid infuison.  Assessment finds lumbar f/e WFL, examination of pelvis finds a posterior rotation of her L ilium and reusltant leg length discrepancy, muscle energy technique used to correct alignment and symmetry was restored w/o a cange in symptoms. HEP issued.  Patient informed of Eval findings, prognosis and POC and is in agreement    Personal Factors and Comorbidities Age  Examination-Activity Limitations Locomotion Level;Transfers    Stability/Clinical Decision Making Stable/Uncomplicated    Clinical Decision Making Low    Rehab Potential Good    PT Frequency 1x / week    PT Duration 8 weeks    PT Treatment/Interventions  ADLs/Self Care Home Management;Aquatic Therapy;Gait training;Stair training;Functional mobility training;Therapeutic activities;Therapeutic exercise;Balance training;Neuromuscular re-education;Patient/family education;Orthotic Fit/Training    PT Next Visit Plan Assess pelvis alignment and treat as appropriate, f/u and expand on HEP    PT Home Exercise Plan [email or web address]    Consulted and Agree with Plan of Care Patient           Patient will benefit from skilled therapeutic intervention in order to improve the following deficits and impairments:  Abnormal gait,Difficulty walking,Decreased activity tolerance,Pain,Decreased mobility  Visit Diagnosis: Unsteadiness on feet  Muscle weakness (generalized)  Radiculopathy, lumbosacral region     Problem List There are no problems to display for this patient.   Hildred Laser PT 09/03/2020, 4:46 PM  [facility] [address] [place], [place], [address] Phone: [phone]   Fax:  [phone]  Name: Laurie Travis MRN: [id number] Date of Birth: 1966-04-15

## 2020-09-06 ENCOUNTER — Ambulatory Visit: Payer: Commercial Managed Care - PPO

## 2020-09-06 ENCOUNTER — Other Ambulatory Visit: Payer: Self-pay

## 2020-09-06 DIAGNOSIS — M6281 Muscle weakness (generalized): Secondary | ICD-10-CM

## 2020-09-06 DIAGNOSIS — R2681 Unsteadiness on feet: Secondary | ICD-10-CM | POA: Diagnosis not present

## 2020-09-06 DIAGNOSIS — M5417 Radiculopathy, lumbosacral region: Secondary | ICD-10-CM

## 2020-09-07 NOTE — Therapy (Addendum)
[facility] [address] [place], [place], [address] Phone: [phone]   Fax:  [phone]  Physical Therapy Treatment  Patient Details  Name: Laurie Travis MRN: [id number] Date of Birth: November 04, 1965 Referring Provider (PT): Joycelyn Schmid MD   Encounter Date: 09/06/2020     09/06/20 1528  Symptoms/Limitations  Subjective No changes to report, may be a little sore from exercise program, neck slightly irritated from curlups  Limitations Sitting;Standing;Walking  How long can you sit comfortably? unrestricted  How long can you stand comfortably? 15 min  How long can you walk comfortably? 30 min  Pain Assessment  Pain Onset More than a month ago       09/06/20 0001  Lumbar Exercises: Supine  Ab Set 10 reps;Limitations  AB Set Limitations curl ups 2x10  Pelvic Tilt 10 reps;Limitations  Pelvic Tilt Limitations 2x10  Clam 10 reps;Limitations  Clam Limitations sidelie 2x10 ea side  Heel Slides 10 reps;Limitations  Heel Slides Limitations attempted 2x10 but L hanstring cramp deterred exercises  Bridge with [employer] Pies Squeeze Non-compliant;10 reps;Limitations  Bridge with [employer] Limitations 2x10  Other Supine Lumbar Exercises B hip fallouts 2x10 against red t-band  Other Supine Lumbar Exercises alt OH flexion with pelvic tilt, 2x10  Knee/Hip Exercises: Aerobic  Nustep L2 8' arms 10  Knee/Hip Exercises: Supine  Other Supine Knee/Hip Exercises alt marches, 2x10                               PT Short Term Goals - 09/03/20 1629      PT SHORT TERM GOAL #1   Title STGs=LTGs             PT Long Term Goals - 09/03/20 1629      PT LONG TERM GOAL #1   Title Patient to demo finalized HEP back to PT /wo need of cuing    Baseline Inital HEP issued    Time 8    Period Weeks    Status New    Target Date 11/05/20      PT LONG TERM GOAL #2   Title Decrease pain from 6/10 to 2/10 in R  lumbosacral region    Baseline 6/10 pain    Time 8    Period Weeks    Target Date 11/05/20      PT LONG TERM GOAL #3   Title Patient to demo symmetric pelvic alignment    Baseline Posterior rotation of L ilium    Time 8    Period Weeks    Status New    Target Date 11/05/20      PT LONG TERM GOAL #4   Title Assess 5x STS score with goal of <15 sec    Baseline TBD    Time 8    Period Weeks    Status New    Target Date 11/05/20             09/06/20 1333  Plan  Clinical Impression Statement Todays skilled session consisted of assessment of pelvic alignment which was symmetric today.  Advanced to aerobic activity followed by updating and reviewing HEP incorporating new exercises as noted to further increase and improve core strength and stabilization.  L hanstrig cramping onset at end of session and treatmen tasks modified toi account for incerased discomfort, offered relief intervention but patient declined opting for self management  Personal Factors and Comorbidities Age  Examination-Activity Limitations Locomotion  Level;Transfers  Pt will benefit from skilled therapeutic intervention in order to improve on the following deficits Abnormal gait;Difficulty walking;Decreased activity tolerance;Pain;Decreased mobility  Stability/Clinical Decision Making Stable/Uncomplicated  Rehab Potential Good  PT Frequency 1x / week  PT Duration 8 weeks  PT Treatment/Interventions ADLs/Self Care Home Management;Aquatic Therapy;Gait training;Stair training;Functional mobility training;Therapeutic activities;Therapeutic exercise;Balance training;Neuromuscular re-education;Patient/family education;Orthotic Fit/Training  PT Next Visit Plan f/u with pelvic alignment , assess HEP, add as tolerated  PT Home Exercise Plan [email or web address]  Consulted and Agree with Plan of Care Patient          Patient will benefit from skilled therapeutic intervention in order to improve the following deficits and  impairments:  Abnormal gait,Difficulty walking,Decreased activity tolerance,Pain,Decreased mobility  Visit Diagnosis: Unsteadiness on feet  Muscle weakness (generalized)  Radiculopathy, lumbosacral region     Problem List There are no problems to display for this patient.   Hildred Laser PT 09/08/2020, 1:47 PM  [facility] [address] [place], [place], [address] Phone: [phone]   Fax:  [phone]  Name: Laurie Travis MRN: [id number] Date of Birth: 1966-03-20

## 2020-09-13 ENCOUNTER — Ambulatory Visit: Payer: Commercial Managed Care - PPO | Attending: Diagnostic Neuroimaging

## 2020-09-13 ENCOUNTER — Other Ambulatory Visit: Payer: Self-pay

## 2020-09-13 DIAGNOSIS — R2681 Unsteadiness on feet: Secondary | ICD-10-CM | POA: Diagnosis present

## 2020-09-13 DIAGNOSIS — M5417 Radiculopathy, lumbosacral region: Secondary | ICD-10-CM | POA: Insufficient documentation

## 2020-09-13 DIAGNOSIS — M6281 Muscle weakness (generalized): Secondary | ICD-10-CM | POA: Insufficient documentation

## 2020-09-13 NOTE — Patient Instructions (Signed)
Access Code: [email or web address] URL: [email or web address] Date: 09/13/2020 Prepared by: Gustavus Bryant  Exercises Curl Up with Arms Crossed - 2 x daily - 7 x weekly - 2 sets - 10 reps Supine Pelvic Tilt - 2 x daily - 7 x weekly - 2 sets - 10 reps Hooklying Single Leg Bent Knee Fallouts with Resistance - 2 x daily - 7 x weekly - 2 sets - 10 reps Supine Lower Trunk Rotation - 2 x daily - 7 x weekly - 2-3 reps - 30s hold Supine March - 2 x daily - 7 x weekly - 2 sets - 10 reps Dead Bug Alternating Arm Extension - 2 x daily - 7 x weekly - 2 sets - 10 reps

## 2020-09-14 NOTE — Therapy (Signed)
[facility] [address] [place], [place], [address] Phone: [phone]   Fax:  [phone]  Physical Therapy Treatment  Patient Details  Name: Laurie Travis MRN: [id number] Date of Birth: 16-Feb-1966 Referring Provider (PT): Joycelyn Schmid MD   Encounter Date: 09/13/2020    Past Medical History:  Diagnosis Date  . Allergy   . Arthritis   . CFS (chronic fatigue syndrome)   . Depression   . GERD (gastroesophageal reflux disease)   . Graves disease   . History of ETT    05/ 2013  per pcp note dr Sharl Ma   subtle changes ST-T waves but low risk for cad  . History of Graves' disease    1999-- dx toxic thyoid nodule s/p  radioactive iodine  . History of palpitations    05/ 2013  normal 24 hour halter monitor  . Hypothyroidism, postradioiodine therapy    1999--  grave's (hyperthyroidism)  . Menorrhagia   . Thyroid eye disease     Past Surgical History:  Procedure Laterality Date  . DILATION AND EVACUATION  2005  . DILITATION & CURRETTAGE/HYSTROSCOPY WITH NOVASURE ABLATION N/A 01/14/2016   Procedure: DILATATION & CURETTAGE/HYSTEROSCOPY WITH NOVASURE ABLATION;  Surgeon: Zelphia Cairo, MD;  Location: [facility];  Service: Gynecology;  Laterality: N/A;  . TONSILLECTOMY  age 54    There were no vitals filed for this visit.   Subjective Assessment - 09/13/20 1534    Subjective No changes in low back pain, bridges causing muscle pain    How long can you sit comfortably? unrestricted    How long can you stand comfortably? 15 min    How long can you walk comfortably? 30 min    Pain Onset More than a month ago                                       PT Short Term Goals - 09/03/20 1629      PT SHORT TERM GOAL #1   Title STGs=LTGs             PT Long Term Goals - 09/03/20 1629      PT LONG TERM GOAL #1   Title Patient to demo finalized HEP back to PT /wo need of cuing     Baseline Inital HEP issued    Time 8    Period Weeks    Status New    Target Date 11/05/20      PT LONG TERM GOAL #2   Title Decrease pain from 6/10 to 2/10 in R lumbosacral region    Baseline 6/10 pain    Time 8    Period Weeks    Target Date 11/05/20      PT LONG TERM GOAL #3   Title Patient to demo symmetric pelvic alignment    Baseline Posterior rotation of L ilium    Time 8    Period Weeks    Status New    Target Date 11/05/20      PT LONG TERM GOAL #4   Title Assess 5x STS score with goal of <15 sec    Baseline TBD    Time 8    Period Weeks    Status New    Target Date 11/05/20                  Patient will benefit from  skilled therapeutic intervention in order to improve the following deficits and impairments:     Visit Diagnosis: Unsteadiness on feet  Muscle weakness (generalized)  Radiculopathy, lumbosacral region     Problem List There are no problems to display for this patient.   Hildred Laser 09/14/2020, 7:12 PM  [facility] [facility] [address] [place], [place], [address] Phone: [phone]   Fax:  [phone]  Name: Laurie Travis MRN: [id number] Date of Birth: [birth year]

## 2020-09-16 NOTE — Therapy (Signed)
[facility] [address] [place], [place], [address] Phone: [phone]   Fax:  [phone]  Physical Therapy Treatment  Patient Details  Name: Elvira Langston MRN: [id number] Date of Birth: 02-19-1966 Referring Provider (PT): Joycelyn Schmid MD   Encounter Date: 09/13/2020    Past Medical History:  Diagnosis Date  . Allergy   . Arthritis   . CFS (chronic fatigue syndrome)   . Depression   . GERD (gastroesophageal reflux disease)   . Graves disease   . History of ETT    05/ 2013  per pcp note dr Sharl Ma   subtle changes ST-T waves but low risk for cad  . History of Graves' disease    1999-- dx toxic thyoid nodule s/p  radioactive iodine  . History of palpitations    05/ 2013  normal 24 hour halter monitor  . Hypothyroidism, postradioiodine therapy    1999--  grave's (hyperthyroidism)  . Menorrhagia   . Thyroid eye disease     Past Surgical History:  Procedure Laterality Date  . DILATION AND EVACUATION  2005  . DILITATION & CURRETTAGE/HYSTROSCOPY WITH NOVASURE ABLATION N/A 01/14/2016   Procedure: DILATATION & CURETTAGE/HYSTEROSCOPY WITH NOVASURE ABLATION;  Surgeon: Zelphia Cairo, MD;  Location: [facility];  Service: Gynecology;  Laterality: N/A;  . TONSILLECTOMY  age 55    There were no vitals filed for this visit.                      [facility] Adult PT Treatment/Exercise - 09/16/20 0001      Lumbar Exercises: Stretches   Lower Trunk Rotation 2 reps;30 seconds;Limitations    Lower Trunk Rotation Limitations 2 reps per side      Lumbar Exercises: Supine   Ab Set 10 reps;Limitations    AB Set Limitations curl ups 2x10    Pelvic Tilt 10 reps;Limitations    Pelvic Tilt Limitations 2x10    Clam 10 reps    Clam Limitations sideliwe 2x10 ea. side    Dead Bug 10 reps;Limitations    Dead Bug Limitations OH UE flexion alt . arms, 2x10    Bridge with [employer] Compliant;10  reps;Limitations    Bridge with [employer] Limitations 2x10    Other Supine Lumbar Exercises Alt. OH flexion with pelvic tilt      Lumbar Exercises: Quadruped   Single Arm Raise Right;Left;10 reps;Limitations    Single Arm Raises Limitations restricted B shoulder ROM      Knee/Hip Exercises: Aerobic   Nustep L3 8' arms 10      Knee/Hip Exercises: Supine   Other Supine Knee/Hip Exercises alt. maches 2x10 with PT                    PT Short Term Goals - 09/03/20 1629      PT SHORT TERM GOAL #1   Title STGs=LTGs             PT Long Term Goals - 09/03/20 1629      PT LONG TERM GOAL #1   Title Patient to demo finalized HEP back to PT /wo need of cuing    Baseline Inital HEP issued    Time 8    Period Weeks    Status New    Target Date 11/05/20      PT LONG TERM GOAL #2   Title Decrease pain from 6/10 to 2/10 in R lumbosacral region    Baseline  6/10 pain    Time 8    Period Weeks    Target Date 11/05/20      PT LONG TERM GOAL #3   Title Patient to demo symmetric pelvic alignment    Baseline Posterior rotation of L ilium    Time 8    Period Weeks    Status New    Target Date 11/05/20      PT LONG TERM GOAL #4   Title Assess 5x STS score with goal of <15 sec    Baseline TBD    Time 8    Period Weeks    Status New    Target Date 11/05/20                  Patient will benefit from skilled therapeutic intervention in order to improve the following deficits and impairments:  Abnormal gait,Difficulty walking,Decreased activity tolerance,Pain,Decreased mobility  Visit Diagnosis: Unsteadiness on feet  Muscle weakness (generalized)  Radiculopathy, lumbosacral region     Problem List There are no problems to display for this patient.   Hildred Laser PT 09/16/2020, 10:33 AM  [facility] [facility] [address] [place], [place], [address] Phone: [phone]   Fax:   [phone]  Name: Joni Colegrove MRN: [id number] Date of Birth: [birth year]

## 2020-09-19 ENCOUNTER — Telehealth: Payer: Self-pay | Admitting: *Deleted

## 2020-09-19 NOTE — Telephone Encounter (Signed)
Received Vumerity wellness report. Patient received welcome call , treatment start call, titration call, no concerns reported. She started Vumerity on 07/04/20, titrated to maintenance dose on 07/17/20. RN was unable to reach her for 60 day FU call.

## 2020-09-20 ENCOUNTER — Other Ambulatory Visit: Payer: Self-pay

## 2020-09-20 ENCOUNTER — Ambulatory Visit: Payer: Commercial Managed Care - PPO

## 2020-09-20 DIAGNOSIS — R2681 Unsteadiness on feet: Secondary | ICD-10-CM

## 2020-09-20 DIAGNOSIS — M5417 Radiculopathy, lumbosacral region: Secondary | ICD-10-CM

## 2020-09-20 DIAGNOSIS — M6281 Muscle weakness (generalized): Secondary | ICD-10-CM

## 2020-09-22 NOTE — Therapy (Addendum)
[facility] [address] [place], [place], [address] Phone: [phone]   Fax:  [phone]  Physical Therapy Treatment  Patient Details  Name: Laurie Travis MRN: [id number] Date of Birth: 02-Sep-1965 Referring Provider (PT): Joycelyn Schmid MD   Encounter Date: 09/20/2020     09/20/20 0716  PT Visits / Re-Eval  Visit Number 4  Number of Visits 9  Date for PT Re-Evaluation 11/05/20  Authorization  Authorization Type UHC  PT Time Calculation  PT Start Time 1530  PT Stop Time 1615  PT Time Calculation (min) 45 min  PT - End of Session  Equipment Utilized During Treatment Gait belt  Activity Tolerance Patient tolerated treatment well  Behavior During Therapy [facility] for tasks assessed/performed    Past Medical History:  Diagnosis Date  . Allergy   . Arthritis   . CFS (chronic fatigue syndrome)   . Depression   . GERD (gastroesophageal reflux disease)   . Graves disease   . History of ETT    05/ 2013  per pcp note dr Sharl Ma   subtle changes ST-T waves but low risk for cad  . History of Graves' disease    1999-- dx toxic thyoid nodule s/p  radioactive iodine  . History of palpitations    05/ 2013  normal 24 hour halter monitor  . Hypothyroidism, postradioiodine therapy    1999--  grave's (hyperthyroidism)  . Menorrhagia   . Thyroid eye disease     Past Surgical History:  Procedure Laterality Date  . DILATION AND EVACUATION  2005  . DILITATION & CURRETTAGE/HYSTROSCOPY WITH NOVASURE ABLATION N/A 01/14/2016   Procedure: DILATATION & CURETTAGE/HYSTEROSCOPY WITH NOVASURE ABLATION;  Surgeon: Zelphia Cairo, MD;  Location: [facility];  Service: Gynecology;  Laterality: N/A;  . TONSILLECTOMY  age 55    There were no vitals filed for this visit.      09/20/20 0001  Lumbar Exercises: Stretches  Lower Trunk Rotation 2 reps;30 seconds;Limitations  Lower Trunk Rotation Limitations 2 reps per side   Pelvic Tilt 10 reps;Limitations  Pelvic Tilt Limitations 2x10  Other Lumbar Stretch Exercise standing thoracic extension stertch, 30s hold  Lumbar Exercises: Seated  Other Seated Lumbar Exercises seated core exercises of chest press, OH lift and chops using 1.1# ball, 2x10  Lumbar Exercises: Supine  Ab Set 10 reps;Limitations  AB Set Limitations curl ups 2x10  Pelvic Tilt 10 reps  Pelvic Tilt Limitations 2x10  Dead Bug 10 reps  Dead Bug Limitations UE OH flexion alt., 2x10  Other Supine Lumbar Exercises alt. LE marching, 2x10 w/pelvic tilt  Knee/Hip Exercises: Aerobic  Nustep L3 8' arms 10                               PT Short Term Goals - 09/03/20 1629      PT SHORT TERM GOAL #1   Title STGs=LTGs             PT Long Term Goals - 09/03/20 1629      PT LONG TERM GOAL #1   Title Patient to demo finalized HEP back to PT /wo need of cuing    Baseline Inital HEP issued    Time 8    Period Weeks    Status New    Target Date 11/05/20      PT LONG TERM GOAL #2   Title Decrease pain from 6/10 to 2/10 in R  lumbosacral region    Baseline 6/10 pain    Time 8    Period Weeks    Target Date 11/05/20      PT LONG TERM GOAL #3   Title Patient to demo symmetric pelvic alignment    Baseline Posterior rotation of L ilium    Time 8    Period Weeks    Status New    Target Date 11/05/20      PT LONG TERM GOAL #4   Title Assess 5x STS score with goal of <15 sec    Baseline TBD    Time 8    Period Weeks    Status New    Target Date 11/05/20              09/20/20 1042  Plan  Clinical Impression Statement Todays skilled session focused on reliving continued back pain through stretching and stabilization techniques, attempted abd of shoulder and hip at wall but unable to tolerate shoulder abd, supine exercises limited by pain in R shoulder as well as ROM restrictions.  Pain confined to R thoracic musculature and exacerbated by stretching.   Incorporated more spine stabilization tasks, discussed aquatics but patient not currently interested  Personal Factors and Comorbidities Age  Examination-Activity Limitations Locomotion Level;Transfers  Pt will benefit from skilled therapeutic intervention in order to improve on the following deficits Abnormal gait;Difficulty walking;Decreased activity tolerance;Pain;Decreased mobility  Stability/Clinical Decision Making Stable/Uncomplicated  Rehab Potential Good  PT Frequency 1x / week  PT Duration 8 weeks  PT Treatment/Interventions ADLs/Self Care Home Management;Aquatic Therapy;Gait training;Stair training;Functional mobility training;Therapeutic activities;Therapeutic exercise;Balance training;Neuromuscular re-education;Patient/family education;Orthotic Fit/Training  PT Next Visit Plan f/u with pelvic alignment , assess HEP and expand as appropriate, continue stretching  PT Home Exercise Plan [email or web address]  Consulted and Agree with Plan of Care Patient         Patient will benefit from skilled therapeutic intervention in order to improve the following deficits and impairments:     Visit Diagnosis: Unsteadiness on feet  Muscle weakness (generalized)  Radiculopathy, lumbosacral region     Problem List There are no problems to display for this patient.   Hildred Laser 09/22/2020, 7:18 AM  [facility] [address] [place], [place], [address] Phone: [phone]   Fax:  [phone]  Name: Laurie Travis MRN: [id number] Date of Birth: [birth year]

## 2020-09-27 ENCOUNTER — Ambulatory Visit: Payer: Commercial Managed Care - PPO

## 2020-09-27 ENCOUNTER — Other Ambulatory Visit: Payer: Self-pay

## 2020-09-27 DIAGNOSIS — R2681 Unsteadiness on feet: Secondary | ICD-10-CM | POA: Diagnosis not present

## 2020-09-27 DIAGNOSIS — M6281 Muscle weakness (generalized): Secondary | ICD-10-CM

## 2020-09-27 DIAGNOSIS — M5417 Radiculopathy, lumbosacral region: Secondary | ICD-10-CM

## 2020-09-28 NOTE — Therapy (Signed)
[facility] [address] [place], [place], [address] Phone: [phone]   Fax:  [phone]  Physical Therapy Treatment  Patient Details  Name: Laurie Travis MRN: [id number] Date of Birth: November 01, 1965 Referring Provider (PT): Joycelyn Schmid MD   Encounter Date: 09/27/2020   PT End of Session - 09/27/20 2031    Visit Number 5    Number of Visits 9    Date for PT Re-Evaluation 11/05/20    Authorization Type UHC    PT Start Time 1530    PT Stop Time 1615    PT Time Calculation (min) 45 min    Equipment Utilized During Treatment Gait belt    Activity Tolerance Patient tolerated treatment well    Behavior During Therapy [facility] for tasks assessed/performed           Past Medical History:  Diagnosis Date  . Allergy   . Arthritis   . CFS (chronic fatigue syndrome)   . Depression   . GERD (gastroesophageal reflux disease)   . Graves disease   . History of ETT    05/ 2013  per pcp note dr Sharl Ma   subtle changes ST-T waves but low risk for cad  . History of Graves' disease    1999-- dx toxic thyoid nodule s/p  radioactive iodine  . History of palpitations    05/ 2013  normal 24 hour halter monitor  . Hypothyroidism, postradioiodine therapy    1999--  grave's (hyperthyroidism)  . Menorrhagia   . Thyroid eye disease     Past Surgical History:  Procedure Laterality Date  . DILATION AND EVACUATION  2005  . DILITATION & CURRETTAGE/HYSTROSCOPY WITH NOVASURE ABLATION N/A 01/14/2016   Procedure: DILATATION & CURETTAGE/HYSTEROSCOPY WITH NOVASURE ABLATION;  Surgeon: Zelphia Cairo, MD;  Location: [facility];  Service: Gynecology;  Laterality: N/A;  . TONSILLECTOMY  age 55    There were no vitals filed for this visit.                                PT Short Term Goals - 09/03/20 1629      PT SHORT TERM GOAL #1   Title STGs=LTGs             PT Long Term Goals - 09/03/20  1629      PT LONG TERM GOAL #1   Title Patient to demo finalized HEP back to PT /wo need of cuing    Baseline Inital HEP issued    Time 8    Period Weeks    Status New    Target Date 11/05/20      PT LONG TERM GOAL #2   Title Decrease pain from 6/10 to 2/10 in R lumbosacral region    Baseline 6/10 pain    Time 8    Period Weeks    Target Date 11/05/20      PT LONG TERM GOAL #3   Title Patient to demo symmetric pelvic alignment    Baseline Posterior rotation of L ilium    Time 8    Period Weeks    Status New    Target Date 11/05/20      PT LONG TERM GOAL #4   Title Assess 5x STS score with goal of <15 sec    Baseline TBD    Time 8    Period Weeks    Status New  Target Date 11/05/20                 Plan - 09/27/20 2031    Clinical Impression Statement a    Personal Factors and Comorbidities Age    Examination-Activity Limitations Locomotion Level;Transfers    Stability/Clinical Decision Making Stable/Uncomplicated    Rehab Potential Good    PT Frequency 1x / week    PT Duration 8 weeks    PT Treatment/Interventions ADLs/Self Care Home Management;Aquatic Therapy;Gait training;Stair training;Functional mobility training;Therapeutic activities;Therapeutic exercise;Balance training;Neuromuscular re-education;Patient/family education;Orthotic Fit/Training    PT Next Visit Plan f/u with pelvic alignment , assess HEP and expand as appropriate, continue stretching    PT Home Exercise Plan [email or web address]    Consulted and Agree with Plan of Care Patient           Patient will benefit from skilled therapeutic intervention in order to improve the following deficits and impairments:  Abnormal gait,Difficulty walking,Decreased activity tolerance,Pain,Decreased mobility  Visit Diagnosis: Unsteadiness on feet  Muscle weakness (generalized)  Radiculopathy, lumbosacral region     Problem List There are no problems to display for this patient.   Hildred Laser 09/28/2020, 8:32 PM  [place] [facility] [address] [place], [place], [address] Phone: [phone]   Fax:  [phone]  Name: Laurie Travis MRN: [id number] Date of Birth: 25-Nov-1965

## 2020-10-04 ENCOUNTER — Ambulatory Visit: Payer: Commercial Managed Care - PPO

## 2020-10-04 ENCOUNTER — Other Ambulatory Visit: Payer: Self-pay

## 2020-10-04 ENCOUNTER — Encounter: Payer: Self-pay | Admitting: Podiatry

## 2020-10-04 ENCOUNTER — Ambulatory Visit (INDEPENDENT_AMBULATORY_CARE_PROVIDER_SITE_OTHER): Payer: Commercial Managed Care - PPO | Admitting: Podiatry

## 2020-10-04 DIAGNOSIS — M722 Plantar fascial fibromatosis: Secondary | ICD-10-CM | POA: Diagnosis not present

## 2020-10-04 NOTE — Progress Notes (Signed)
Subjective:   Patient ID: Laurie Travis, female   DOB: 56 y.o.   MRN: [id number]   HPI Patient states still having quite a bit of pain in the plantar aspect of the right heel with inflammation fluid and patient does not need to be active history of MS and needs to be doing exercises to help.   ROS      Objective:  Physical Exam  Neurovascular status intact exquisite discomfort still noted plantar aspect right heel insertional point tendon calcaneus with moderate depression of the arch     Assessment:  Acute Planter fasciitis right inflammation fluid     Plan:  H&P reviewed condition sterile prep injected the plantar fascial right 3 mg Kenalog 5 mg Xylocaine advised on ice therapy anti-inflammatories and casted for functional orthotic to lift the arch.  Encouraged to call with questions also lifted up the left heel slightly which appears to be slightly shorter than the right leg

## 2020-10-11 ENCOUNTER — Other Ambulatory Visit: Payer: Self-pay

## 2020-10-11 ENCOUNTER — Ambulatory Visit: Payer: Commercial Managed Care - PPO | Attending: Diagnostic Neuroimaging

## 2020-10-11 DIAGNOSIS — M5417 Radiculopathy, lumbosacral region: Secondary | ICD-10-CM | POA: Diagnosis present

## 2020-10-11 DIAGNOSIS — M6281 Muscle weakness (generalized): Secondary | ICD-10-CM | POA: Insufficient documentation

## 2020-10-11 DIAGNOSIS — R2681 Unsteadiness on feet: Secondary | ICD-10-CM

## 2020-10-11 NOTE — Patient Instructions (Signed)
Access Code: [email or web address] URL: [email or web address] Date: 10/11/2020 Prepared by: Gustavus Bryant  Exercises Curl Up with Arms Crossed - 2 x daily - 7 x weekly - 2 sets - 10 reps Supine Pelvic Tilt - 2 x daily - 7 x weekly - 2 sets - 10 reps Supine Lower Trunk Rotation - 2 x daily - 7 x weekly - 2-3 reps - 30s hold Supine March - 2 x daily - 7 x weekly - 2 sets - 10 reps Dead Bug Alternating Arm Extension - 2 x daily - 7 x weekly - 2 sets - 10 reps Prone Chest Stretch on Chair - 2 x daily - 7 x weekly - 2 sets - 10 reps Clamshell - 2 x daily - 7 x weekly - 2 sets - 10 reps

## 2020-10-12 NOTE — Therapy (Signed)
[facility] [address] [place], [place], [address] Phone: [phone]   Fax:  [phone]  Physical Therapy Treatment  Patient Details  Name: Laurie Travis MRN: [id number] Date of Birth: [birth year] Referring Provider (PT): Joycelyn Schmid MD   Encounter Date: 10/11/2020   PT End of Session - 10/11/20 1617    Visit Number 6    Number of Visits 9    Date for PT Re-Evaluation 11/05/20    Authorization Type UHC    PT Start Time 1530    PT Stop Time 1615    PT Time Calculation (min) 45 min    Equipment Utilized During Treatment Gait belt    Activity Tolerance Patient tolerated treatment well    Behavior During Therapy [facility] for tasks assessed/performed           Past Medical History:  Diagnosis Date  . Allergy   . Arthritis   . CFS (chronic fatigue syndrome)   . Depression   . GERD (gastroesophageal reflux disease)   . Graves disease   . History of ETT    05/ 2013  per pcp note dr Sharl Ma   subtle changes ST-T waves but low risk for cad  . History of Graves' disease    1999-- dx toxic thyoid nodule s/p  radioactive iodine  . History of palpitations    05/ 2013  normal 24 hour halter monitor  . Hypothyroidism, postradioiodine therapy    1999--  grave's (hyperthyroidism)  . Menorrhagia   . Thyroid eye disease     Past Surgical History:  Procedure Laterality Date  . DILATION AND EVACUATION  2005  . DILITATION & CURRETTAGE/HYSTROSCOPY WITH NOVASURE ABLATION N/A 01/14/2016   Procedure: DILATATION & CURETTAGE/HYSTEROSCOPY WITH NOVASURE ABLATION;  Surgeon: Zelphia Cairo, MD;  Location: [facility];  Service: Gynecology;  Laterality: N/A;  . TONSILLECTOMY  age 32    There were no vitals filed for this visit.   Subjective Assessment - 10/11/20 1534    Subjective Improved overall symptoms improved , went from sharp pain to dull ache, symptoms localized to R lumbosacral region    Limitations  Sitting;Standing;Walking    How long can you sit comfortably? unrestricted    How long can you stand comfortably? 15 min    How long can you walk comfortably? 30 min    Pain Onset More than a month ago               10/11/20 0001  Ambulation/Gait  Ambulation/Gait Yes  Ambulation/Gait Assistance 7: Independent  Ambulation Distance (Feet) 115 Feet  Assistive device None  Gait Pattern WFL;Step-through pattern  Ambulation Surface Level;Indoor  Lumbar Exercises: Sidelying  Clam Both;10 reps  Clam Limitations 2x10  Knee/Hip Exercises: Standing  Other Standing Knee Exercises standing hip and contralateral UE abduction with support of foam roll for stability, 10 reps per side  Manual Therapy  Manual Therapy Joint mobilization;Soft tissue mobilization  Manual therapy comments Performed thoracic mobs into extenison from T-10 to T-2 facilitating MWMs, 5 moba per level  Soft tissue mobilization assessed pelvic alignment as well as leg length for discrepancy, STM to R piriformis                             PT Short Term Goals - 09/03/20 1629      PT SHORT TERM GOAL #1   Title STGs=LTGs  PT Long Term Goals - 09/03/20 1629      PT LONG TERM GOAL #1   Title Patient to demo finalized HEP back to PT /wo need of cuing    Baseline Inital HEP issued    Time 8    Period Weeks    Status New    Target Date 11/05/20      PT LONG TERM GOAL #2   Title Decrease pain from 6/10 to 2/10 in R lumbosacral region    Baseline 6/10 pain    Time 8    Period Weeks    Target Date 11/05/20      PT LONG TERM GOAL #3   Title Patient to demo symmetric pelvic alignment    Baseline Posterior rotation of L ilium    Time 8    Period Weeks    Status New    Target Date 11/05/20      PT LONG TERM GOAL #4   Title Assess 5x STS score with goal of <15 sec    Baseline TBD    Time 8    Period Weeks    Status New    Target Date 11/05/20                  Plan - 10/11/20 1617    Clinical Impression Statement Todays rehab session focused on gluteus medius sterngthening B with R side weaker than L, added strenghening tasks to HEP to increased strength, thoracic mob into extension, 5 reps at levels T3-10,    Personal Factors and Comorbidities Age    Examination-Activity Limitations Locomotion Level;Transfers    Stability/Clinical Decision Making Stable/Uncomplicated    Rehab Potential Good    PT Frequency 1x / week    PT Duration 8 weeks    PT Treatment/Interventions ADLs/Self Care Home Management;Aquatic Therapy;Gait training;Stair training;Functional mobility training;Therapeutic activities;Therapeutic exercise;Balance training;Neuromuscular re-education;Patient/family education;Orthotic Fit/Training    PT Next Visit Plan f/u with pelvic alignment , continue hip and gluteal medius strengthening    PT Home Exercise Plan [email or web address]    Consulted and Agree with Plan of Care Patient           Patient will benefit from skilled therapeutic intervention in order to improve the following deficits and impairments:  Abnormal gait,Difficulty walking,Decreased activity tolerance,Pain,Decreased mobility  Visit Diagnosis: Unsteadiness on feet  Muscle weakness (generalized)  Radiculopathy, lumbosacral region     Problem List There are no problems to display for this patient.   Hildred Laser PT 10/12/2020, 3:25 PM  [place] [facility] [address] [place], [place], [address] Phone: [phone]   Fax:  [phone]  Name: Laurie Travis MRN: [id number] Date of Birth: [birth year]

## 2020-10-18 ENCOUNTER — Ambulatory Visit: Payer: Commercial Managed Care - PPO

## 2020-10-18 ENCOUNTER — Other Ambulatory Visit: Payer: Self-pay

## 2020-10-18 DIAGNOSIS — R2681 Unsteadiness on feet: Secondary | ICD-10-CM

## 2020-10-18 DIAGNOSIS — M5417 Radiculopathy, lumbosacral region: Secondary | ICD-10-CM

## 2020-10-18 DIAGNOSIS — M6281 Muscle weakness (generalized): Secondary | ICD-10-CM

## 2020-10-19 NOTE — Therapy (Deleted)
[facility] [address] [place], [place], [address] Phone: [phone]   Fax:  [phone]  October 19, 2020   No Recipients  Physical Therapy Discharge Summary  Patient: Laurie Travis  MRN: [id number]  Date of Birth: [birth year]   Diagnosis: Unsteadiness on feet  Muscle weakness (generalized)  Radiculopathy, lumbosacral region Referring Provider (PT): Joycelyn Schmid MD   The above patient had been seen in Physical Therapy *** times of *** treatments scheduled with *** no shows and *** cancellations.  The treatment consisted of *** The patient is: {improved/worse/unchanged:[id number]}  Subjective: ***  Discharge Findings: ***  Functional Status at Discharge: ***  {QQIWL:[id number]}   Plan - 10/18/20 1643     Clinical Impression Statement Todays session consisted of finalizing and reviewing HEP, answering questions in preparation for DC, pelvic alignment assessment adn mobilzation of thoracic spine for symptom relief    Personal Factors and Comorbidities Age    Examination-Activity Limitations Locomotion Level;Transfers    Stability/Clinical Decision Making Stable/Uncomplicated    Rehab Potential Good    PT Frequency 1x / week    PT Duration 8 weeks    PT Treatment/Interventions ADLs/Self Care Home Management;Aquatic Therapy;Gait training;Stair training;Functional mobility training;Therapeutic activities;Therapeutic exercise;Balance training;Neuromuscular re-education;Patient/family education;Orthotic Fit/Training    PT Next Visit Plan DC to HEP    PT Home Exercise Plan [id number]    Consulted and Agree with Plan of Care Patient             Sincerely,   Hildred Laser, PT   CC No Recipients  [facility] [address] [place], [place], [address] Phone: [phone]   Fax:  [phone]  Patient: Laurie Travis  MRN: [id number]  Date of Birth:  Sep 25, 1965

## 2020-10-19 NOTE — Therapy (Signed)
[facility] [address] [place], [place], [address] Phone: [phone]   Fax:  [phone]  Physical Therapy Treatment/Discharge Summary  Patient Details  Name: Laurie Travis MRN: [id number] Date of Birth: June 25, 1965 Referring Provider (PT): Andrey Spearman MD   Encounter Date: 10/18/2020  PHYSICAL THERAPY DISCHARGE SUMMARY  Visits from Start of Care: 7  Current functional level related to goals / functional outcomes: Patient ready for independent management   Remaining deficits: Strength and posture   Education / Equipment: Patient has HEP and will be joining [employer]   Patient agrees to discharge. Patient goals were met. Patient is being discharged due to being pleased with the current functional level.    PT End of Session - 10/18/20 1540     Visit Number 7    Number of Visits 9    Date for PT Re-Evaluation 11/05/20    Authorization Type UHC    PT Start Time 0300    PT Stop Time 9233    PT Time Calculation (min) 45 min    Equipment Utilized During Treatment Gait belt    Activity Tolerance Patient tolerated treatment well    Behavior During Therapy [facility] for tasks assessed/performed             Past Medical History:  Diagnosis Date   Allergy    Arthritis    CFS (chronic fatigue syndrome)    Depression    GERD (gastroesophageal reflux disease)    Graves disease    History of ETT    05/ 2013  per pcp note dr Buddy Duty   subtle changes ST-T waves but low risk for cad   History of Graves' disease    1999-- dx toxic thyoid nodule s/p  radioactive iodine   History of palpitations    05/ 2013  normal 24 hour halter monitor   Hypothyroidism, postradioiodine therapy    1999--  grave's (hyperthyroidism)   Menorrhagia    Thyroid eye disease     Past Surgical History:  Procedure Laterality Date   DILATION AND EVACUATION  2005   [place] N/A  01/14/2016   Procedure: DILATATION & CURETTAGE/HYSTEROSCOPY WITH NOVASURE ABLATION;  Surgeon: Marylynn Pearson, MD;  Location: [facility];  Service: Gynecology;  Laterality: N/A;   TONSILLECTOMY  age 5    There were no vitals filed for this visit.   Subjective Assessment - 10/18/20 1641     Subjective Improved overall symptoms improved , has been compliant with HEP and feels she is ready to join [place] and manage her symptoms I    Pertinent History In 2000 patient was diagnosed with chronic fatigue syndrome.  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  Possibility of MS was raised but she did not have any further follow-up with neurology at that time.    Limitations Sitting;Standing;Walking    How long can you sit comfortably? unrestricted    How long can you stand comfortably? 15 min    How long can you walk comfortably? 30 min    Pain Onset More than a month ago                10/18/20 0001  Ambulation/Gait  Ambulation/Gait Yes  Ambulation/Gait Assistance 7: Independent  Ambulation Distance (Feet) 115 Feet  Assistive device None  Gait Pattern WFL;Step-through pattern  Ambulation Surface Level;Indoor  Lumbar  Exercises: Seated  Other Seated Lumbar Exercises seated exercises for core strength and stability including downward pressure on roller to facilitate latisimus, hip tosses, shoulder tosses, chops and Victories with 4.4all all to 10 reps  Lumbar Exercises: Supine  Other Supine Lumbar Exercises B hip fallouts 2x10 against blue band  Knee/Hip Exercises: Standing  Other Standing Knee Exercises standing hip and contralateral UE abduction with support of foam roll for stability, 10 reps per side  Manual Therapy  Manual Therapy Joint mobilization;Soft tissue mobilization  Manual therapy comments Performed thoracic mobs into extenison from T-10 to T-2 facilitating MWMs, 5  moba per level  Soft tissue mobilization assessed pelvic alignment as well as leg length for discrepancy, STM to R piriformis                           PT Education - 10/19/20 1642     Education Details Finalized HEP reviwed with patient    Person(s) Educated Patient    Methods Explanation;Demonstration;Handout    Comprehension Verbalized understanding;Returned demonstration              PT Short Term Goals - 09/03/20 1629       PT SHORT TERM GOAL #1   Title STGs=LTGs               PT Long Term Goals - 10/18/20 1549       PT LONG TERM GOAL #1   Title Patient to demo finalized HEP back to PT /wo need of cuing    Baseline Inital HEP issued; 10/18/20 I in HEP finalized    Time 8    Period Weeks    Status Achieved      PT LONG TERM GOAL #2   Title Decrease pain from 6/10 to 2/10 in R lumbosacral region    Baseline 6/10 pain; 10/18/20 3-6/10 discomfort    Time 8    Period Weeks    Status Partially Met      PT LONG TERM GOAL #3   Title Patient to demo symmetric pelvic alignment    Baseline Posterior rotation of L ilium;  10/18/20 Pelvic alignment symmetrical    Time 8    Period Weeks    Status Achieved      PT LONG TERM GOAL #4   Title Assess 5x STS score with goal of <15 sec    Baseline TBD; 10/18/20 5x STS score 13.3s    Time 8    Period Weeks    Status Achieved                   Plan - 10/18/20 1643     Clinical Impression Statement Todays session consisted of finalizing and reviewing HEP, answering questions in preparation for DC, pelvic alignment assessment adn mobilzation of thoracic spine for symptom relief    Personal Factors and Comorbidities Age    Examination-Activity Limitations Locomotion Level;Transfers    Stability/Clinical Decision Making Stable/Uncomplicated    Rehab Potential Good    PT Frequency 1x / week    PT Duration 8 weeks    PT Treatment/Interventions ADLs/Self Care Home Management;Aquatic  Therapy;Gait training;Stair training;Functional mobility training;Therapeutic activities;Therapeutic exercise;Balance training;Neuromuscular re-education;Patient/family education;Orthotic Fit/Training    PT Next Visit Plan DC to HEP    PT Home Exercise Plan [id number]    Consulted and Agree with Plan of Care Patient             Patient will benefit from  skilled therapeutic intervention in order to improve the following deficits and impairments:  Abnormal gait, Difficulty walking, Decreased activity tolerance, Pain, Decreased mobility  Visit Diagnosis: Unsteadiness on feet  Muscle weakness (generalized)  Radiculopathy, lumbosacral region     Problem List There are no problems to display for this patient.   Lanice Shirts PT 10/19/2020, 4:56 PM  [facility] [address] [place], [place], [address] Phone: [phone]   Fax:  [phone]  Name: Laurie Travis MRN: [id number] Date of Birth: 1966-01-18

## 2020-10-19 NOTE — Therapy (Deleted)
[facility] [address] [place], [place], [address] Phone: [phone]   Fax:  [phone]  Physical Therapy Treatment/Discharge Summary  Patient Details  Name: Laurie Travis MRN: [id number] Date of Birth: February 01, 1966 Referring Provider (PT): Andrey Spearman MD   Encounter Date: 10/18/2020  PHYSICAL THERAPY DISCHARGE SUMMARY  Visits from Start of Care: 7  Current functional level related to goals / functional outcomes: Patient ready for independent management   Remaining deficits: Strength and posture   Education / Equipment: Patient has HEP and will be joining [employer]   Patient agrees to discharge. Patient goals were met. Patient is being discharged due to being pleased with the current functional level.    PT End of Session - 10/18/20 1540     Visit Number 7    Number of Visits 9    Date for PT Re-Evaluation 11/05/20    Authorization Type UHC    PT Start Time 0881    PT Stop Time 1031    PT Time Calculation (min) 45 min    Equipment Utilized During Treatment Gait belt    Activity Tolerance Patient tolerated treatment well    Behavior During Therapy [facility] for tasks assessed/performed             Past Medical History:  Diagnosis Date   Allergy    Arthritis    CFS (chronic fatigue syndrome)    Depression    GERD (gastroesophageal reflux disease)    Graves disease    History of ETT    05/ 2013  per pcp note dr Buddy Duty   subtle changes ST-T waves but low risk for cad   History of Graves' disease    1999-- dx toxic thyoid nodule s/p  radioactive iodine   History of palpitations    05/ 2013  normal 24 hour halter monitor   Hypothyroidism, postradioiodine therapy    1999--  grave's (hyperthyroidism)   Menorrhagia    Thyroid eye disease     Past Surgical History:  Procedure Laterality Date   DILATION AND EVACUATION  2005   [place] N/A  01/14/2016   Procedure: DILATATION & CURETTAGE/HYSTEROSCOPY WITH NOVASURE ABLATION;  Surgeon: Marylynn Pearson, MD;  Location: [facility];  Service: Gynecology;  Laterality: N/A;   TONSILLECTOMY  age 6    There were no vitals filed for this visit.   Subjective Assessment - 10/18/20 1641     Subjective Improved overall symptoms improved , has been compliant with HEP and feels she is ready to join [place] and manage her symptoms I    Pertinent History In 2000 patient was diagnosed with chronic fatigue syndrome.  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  Possibility of MS was raised but she did not have any further follow-up with neurology at that time.    Limitations Sitting;Standing;Walking    How long can you sit comfortably? unrestricted    How long can you stand comfortably? 15 min    How long can you walk comfortably? 30 min    Pain Onset More than a month ago                10/18/20 0001  Ambulation/Gait  Ambulation/Gait Yes  Ambulation/Gait Assistance 7: Independent  Ambulation Distance (Feet) 115 Feet  Assistive device None  Gait Pattern WFL;Step-through pattern  Ambulation Surface Level;Indoor  Lumbar  Exercises: Seated  Other Seated Lumbar Exercises seated exercises for core strength and stability including downward pressure on roller to facilitate latisimus, hip tosses, shoulder tosses, chops and Victories with 4.4all all to 10 reps  Lumbar Exercises: Supine  Other Supine Lumbar Exercises B hip fallouts 2x10 against blue band  Knee/Hip Exercises: Standing  Other Standing Knee Exercises standing hip and contralateral UE abduction with support of foam roll for stability, 10 reps per side  Manual Therapy  Manual Therapy Joint mobilization;Soft tissue mobilization  Manual therapy comments Performed thoracic mobs into extenison from T-10 to T-2 facilitating MWMs, 5  moba per level  Soft tissue mobilization assessed pelvic alignment as well as leg length for discrepancy, STM to R piriformis                           PT Education - 10/19/20 1642     Education Details Finalized HEP reviwed with patient    Person(s) Educated Patient    Methods Explanation;Demonstration;Handout    Comprehension Verbalized understanding;Returned demonstration              PT Short Term Goals - 09/03/20 1629       PT SHORT TERM GOAL #1   Title STGs=LTGs               PT Long Term Goals - 10/18/20 1549       PT LONG TERM GOAL #1   Title Patient to demo finalized HEP back to PT /wo need of cuing    Baseline Inital HEP issued; 10/18/20 I in HEP finalized    Time 8    Period Weeks    Status Achieved      PT LONG TERM GOAL #2   Title Decrease pain from 6/10 to 2/10 in R lumbosacral region    Baseline 6/10 pain; 10/18/20 3-6/10 discomfort    Time 8    Period Weeks    Status Partially Met      PT LONG TERM GOAL #3   Title Patient to demo symmetric pelvic alignment    Baseline Posterior rotation of L ilium;  10/18/20 Pelvic alignment symmetrical    Time 8    Period Weeks    Status Achieved      PT LONG TERM GOAL #4   Title Assess 5x STS score with goal of <15 sec    Baseline TBD; 10/18/20 5x STS score 13.3s    Time 8    Period Weeks    Status Achieved                   Plan - 10/18/20 1643     Clinical Impression Statement Todays session consisted of finalizing and reviewing HEP, answering questions in preparation for DC, pelvic alignment assessment adn mobilzation of thoracic spine for symptom relief    Personal Factors and Comorbidities Age    Examination-Activity Limitations Locomotion Level;Transfers    Stability/Clinical Decision Making Stable/Uncomplicated    Rehab Potential Good    PT Frequency 1x / week    PT Duration 8 weeks    PT Treatment/Interventions ADLs/Self Care Home Management;Aquatic  Therapy;Gait training;Stair training;Functional mobility training;Therapeutic activities;Therapeutic exercise;Balance training;Neuromuscular re-education;Patient/family education;Orthotic Fit/Training    PT Next Visit Plan DC to HEP    PT Home Exercise Plan [id number]    Consulted and Agree with Plan of Care Patient             Patient will benefit from  skilled therapeutic intervention in order to improve the following deficits and impairments:  Abnormal gait, Difficulty walking, Decreased activity tolerance, Pain, Decreased mobility  Visit Diagnosis: Unsteadiness on feet  Muscle weakness (generalized)  Radiculopathy, lumbosacral region     Problem List There are no problems to display for this patient.   Lanice Shirts PT 10/19/2020, 4:54 PM  [facility] [address] [place], [place], [address] Phone: [phone]   Fax:  [phone]  Name: Conda Wannamaker MRN: [id number] Date of Birth: 14-Jun-1965

## 2020-10-22 ENCOUNTER — Other Ambulatory Visit: Payer: Self-pay

## 2020-10-22 ENCOUNTER — Ambulatory Visit (INDEPENDENT_AMBULATORY_CARE_PROVIDER_SITE_OTHER): Payer: Commercial Managed Care - PPO | Admitting: *Deleted

## 2020-10-22 DIAGNOSIS — M722 Plantar fascial fibromatosis: Secondary | ICD-10-CM

## 2020-10-22 NOTE — Progress Notes (Signed)
Patient presents today to pick up custom molded foot orthotics, diagnosed with plantar fasciitis by Dr. Charlsie Merles.   Orthotics were dispensed and fit was satisfactory. Reviewed instructions for break-in and wear. Written instructions given to patient.  Patient will follow up as needed.   [facility] - order # [id number]  The left orthotics has a heel lift and she was noticing quite a bit of difference on how her hip felt and was also concerned that her heel was coming out of her shoe a bit. Advised to take and try in other shoes first and if still uncomfortable, let us know.

## 2020-10-24 ENCOUNTER — Ambulatory Visit: Payer: Commercial Managed Care - PPO

## 2020-11-04 ENCOUNTER — Encounter: Payer: Self-pay | Admitting: *Deleted

## 2021-01-02 ENCOUNTER — Encounter: Payer: Self-pay | Admitting: Podiatry

## 2021-01-02 ENCOUNTER — Ambulatory Visit (INDEPENDENT_AMBULATORY_CARE_PROVIDER_SITE_OTHER): Payer: Commercial Managed Care - PPO | Admitting: Podiatry

## 2021-01-02 ENCOUNTER — Other Ambulatory Visit: Payer: Self-pay

## 2021-01-02 DIAGNOSIS — M722 Plantar fascial fibromatosis: Secondary | ICD-10-CM | POA: Diagnosis not present

## 2021-01-02 MED ORDER — DICLOFENAC SODIUM 75 MG PO TBEC: 75.0000 mg | DELAYED_RELEASE_TABLET | Freq: Two times a day (BID) | ORAL | 2 refills | Status: DC

## 2021-01-02 MED ORDER — TRIAMCINOLONE ACETONIDE 10 MG/ML IJ SUSP
10.0000 mg | Freq: Once | INTRAMUSCULAR | Status: AC
  Administered 2021-01-02: 10 mg

## 2021-01-02 NOTE — Progress Notes (Signed)
Subjective:   Patient ID: Laurie Travis, female   DOB: 55 y.o.   MRN: [id number]   HPI Patient states she has been doing better for period time but over the last month the pain is returned in the heel and is worse when she gets up in the morning after periods of sitting and she has trouble stretching it properly   ROS      Objective:  Physical Exam  Neurovascular status intact with patient found to have exquisite discomfort medial fascial band right at the insertional point tendon calcaneus moderate discomfort on the left not to the same degree with moderate flatfoot deformity     Assessment:  Acute plantar fasciitis right with inflammation fluid buildup mild pain left     Plan:  H&P reviewed condition applied night splint to stretch the foot and went ahead did sterile prep and injected the plantar fashion 3 mg Kenalog 5 mg Xylocaine with instructions on stretching and ice therapy.  Reappoint as needed

## 2021-01-11 IMAGING — CT CT ORBITS W/O CM
1 series · 16 of 30 positions shown, 20 images · non-contrast
Comparison: None.

CLINICAL DATA: Double vision. Graves disease. Previous iodine
treatment.

EXAM:
CT ORBITS WITHOUT CONTRAST
TECHNIQUE: Multidetector CT images were obtained using the standard protocol
without intravenous contrast.

[Series 6: orbits-soft · axial · 0.35mm/px · z∈[-169,-73]mm · 16 of 52 slices shown, 20 images]
[im 2/52  brain]
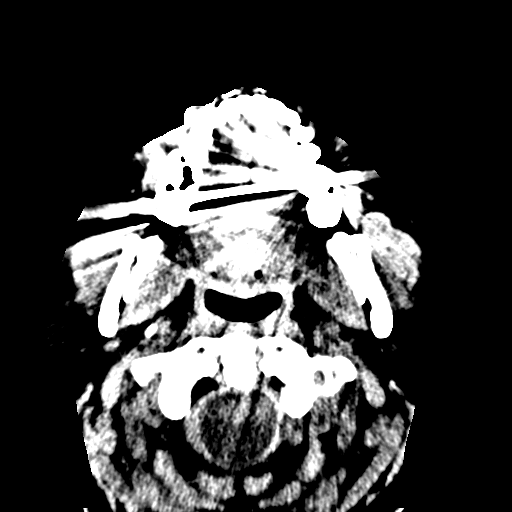
[im 2/52  bone]
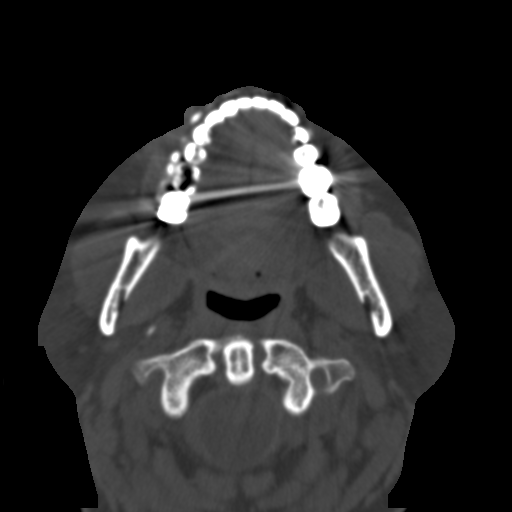
[im 6/52  bone]
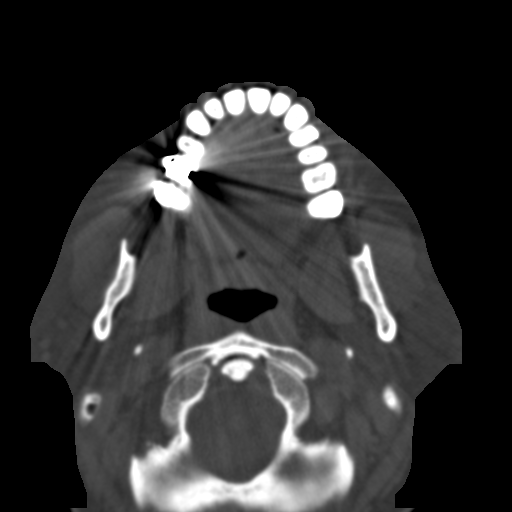
[im 9/52  bone]
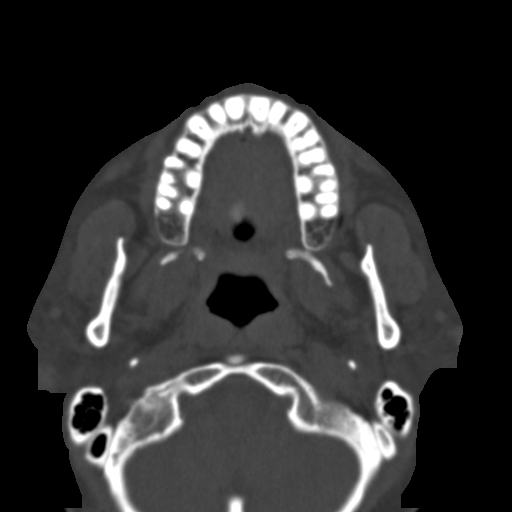
[im 13/52  bone]
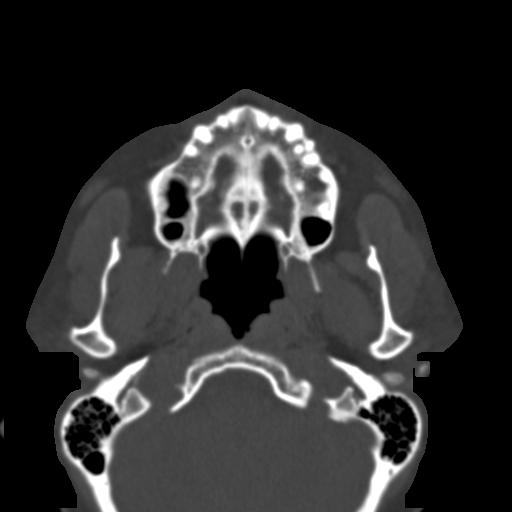
[im 15/52  brain]
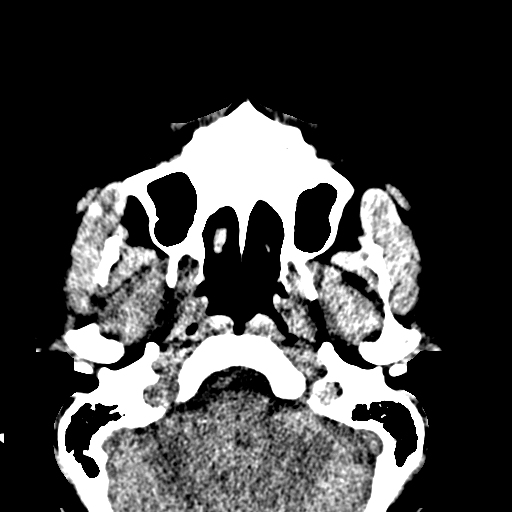
[im 15/52  bone]
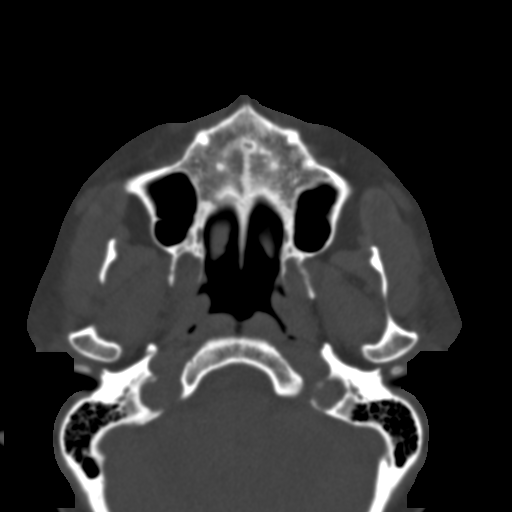
[im 18/52  bone]
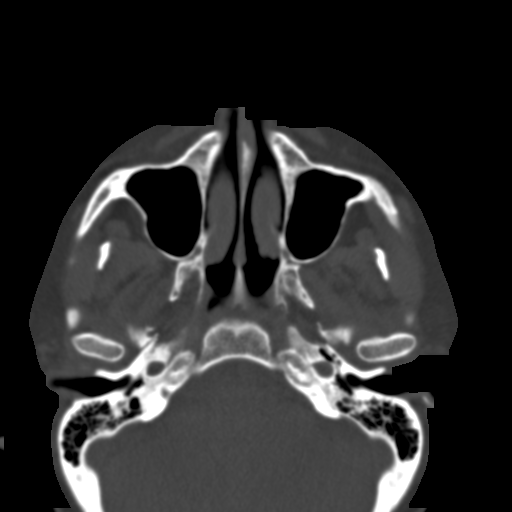
[im 22/52  bone]
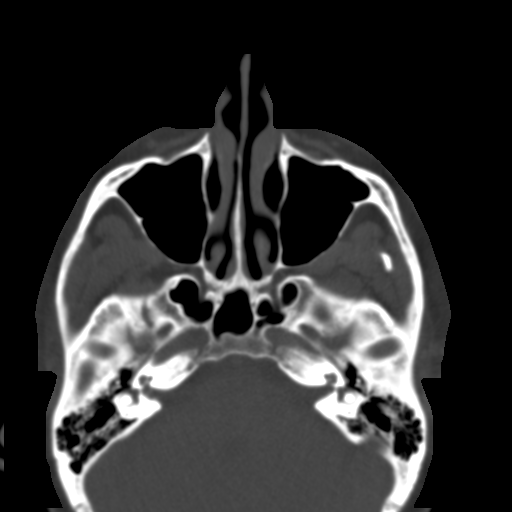
[im 25/52  bone]
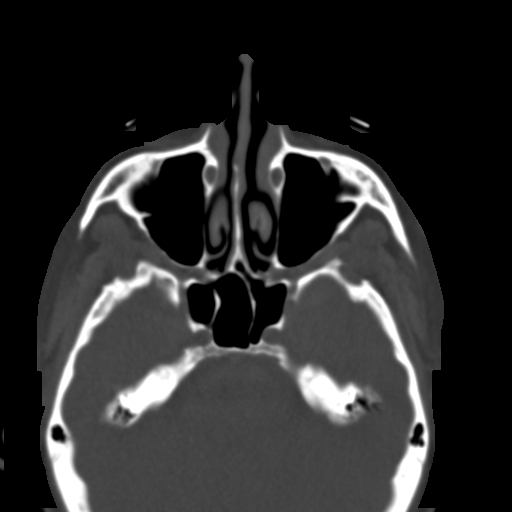
[im 27/52  brain]
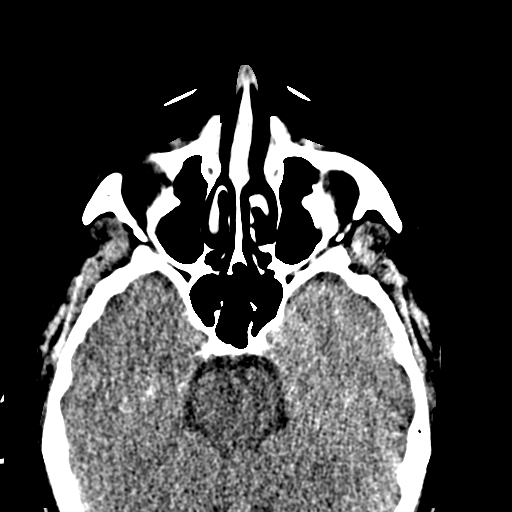
[im 27/52  bone]
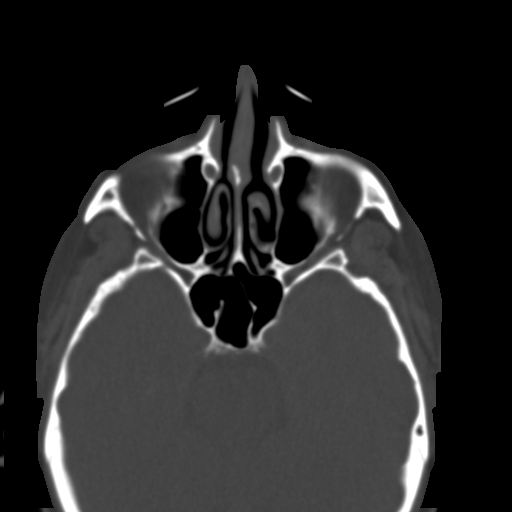
[im 30/52  bone]
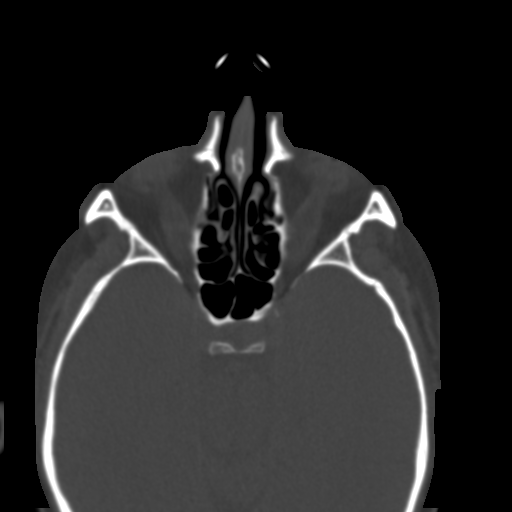
[im 34/52  bone]
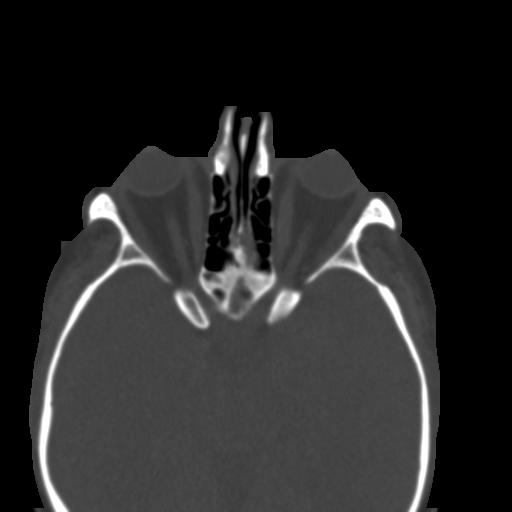
[im 37/52  bone]
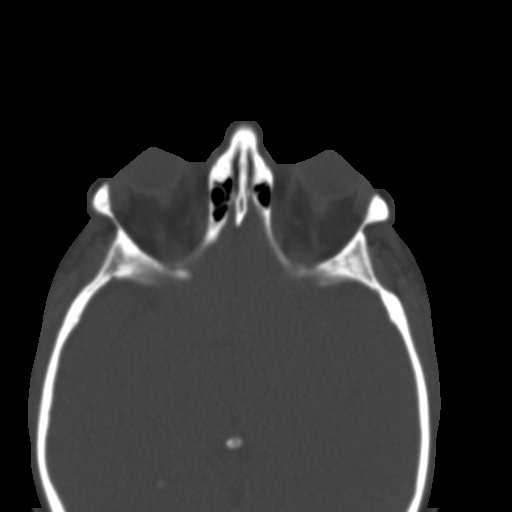
[im 39/52  brain]
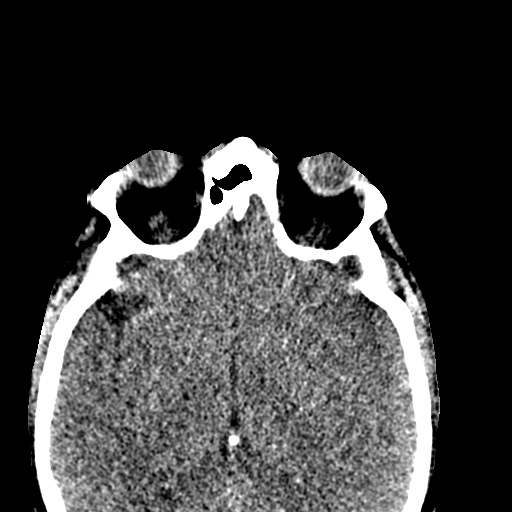
[im 39/52  bone]
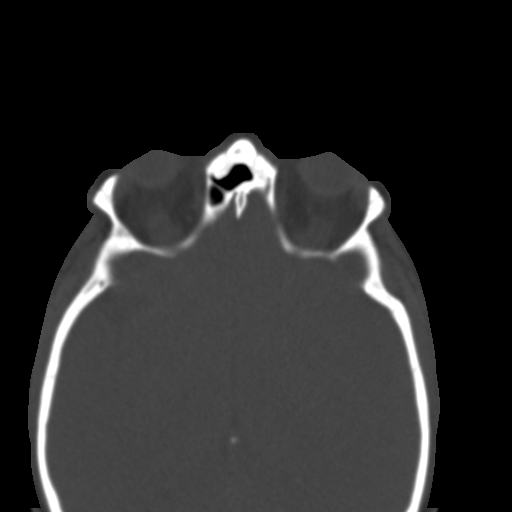
[im 43/52  bone]
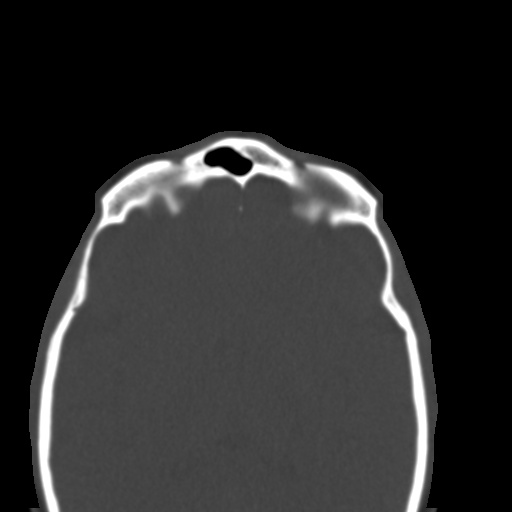
[im 46/52  bone]
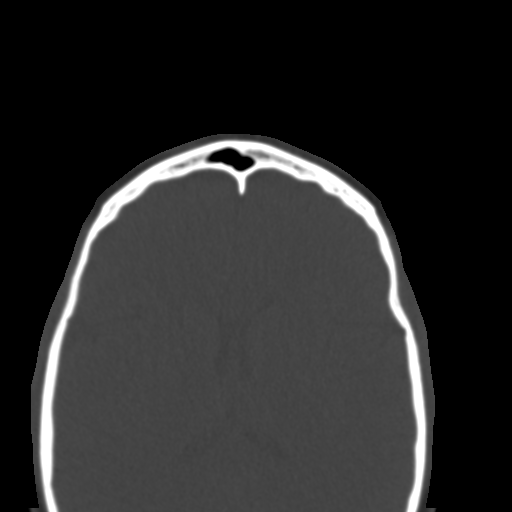
[im 50/52  bone]
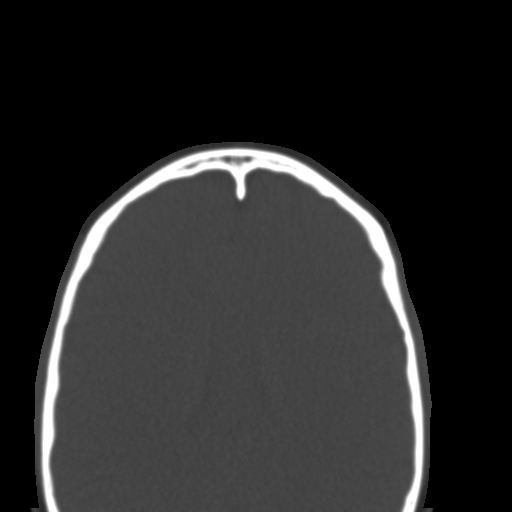

[16 of 30 positions shown; findings below may reference images not displayed]

FINDINGS: Orbits: Both globes appear normal. Optic nerves appear normal.
Orbital fat is slightly prominent. Borderline proptosis. Enlargement
of the inferior rectus muscles and borderline minimal enlargement of
the medial rectus muscles, consistent with findings of thyroid
orbitopathy or treated thyroid orbitopathy. No other mass lesion.

Visualized sinuses: Clear

Soft tissues: Otherwise normal

Limited intracranial: Normal
IMPRESSION: Mild proptosis. Abundant orbital fat. Enlargement of the inferior
rectus muscles and mild enlargement of the medial rectus muscles.
Findings consistent with thyroid orbitopathy or treated thyroid
orbitopathy.

## 2021-02-18 ENCOUNTER — Ambulatory Visit: Payer: Commercial Managed Care - PPO | Admitting: Diagnostic Neuroimaging

## 2021-02-25 ENCOUNTER — Encounter: Payer: Self-pay | Admitting: Diagnostic Neuroimaging

## 2021-02-25 ENCOUNTER — Ambulatory Visit (INDEPENDENT_AMBULATORY_CARE_PROVIDER_SITE_OTHER): Payer: Commercial Managed Care - PPO | Admitting: Diagnostic Neuroimaging

## 2021-02-25 VITALS — BP 130/81 | HR 85 | Ht 66.0 in | Wt 233.0 lb

## 2021-02-25 DIAGNOSIS — G35 Multiple sclerosis: Secondary | ICD-10-CM | POA: Diagnosis not present

## 2021-02-25 MED ORDER — CYCLOBENZAPRINE HCL 5 MG PO TABS: 5.0000 mg | ORAL_TABLET | Freq: Three times a day (TID) | ORAL | 3 refills | Status: DC | PRN

## 2021-02-25 NOTE — Patient Instructions (Signed)
  RRMS (relapsing remitting multiple sclerosis; first attack 2004 with transverse myelitis, then recurrent symptoms in 2021) - continue vumerity; check labs - repeat MRI brain in Feb 2023  MUSCLE SPASMS - cyclobenzaprine 5-10mg  as needed   LOW BACK PAIN (radiating to right leg) - continue PT exercises at home; may consider MRI lumbar spine in future

## 2021-02-25 NOTE — Progress Notes (Signed)
[facility]  PATIENT: Laurie Travis DOB: [birth year]  REFERRING CLINICIAN: Eliezer Lofts, MD HISTORY FROM: patient REASON FOR VISIT: follow up   HISTORICAL  CHIEF COMPLAINT:  Chief Complaint  Patient presents with   Multiple Sclerosis    Rm 7, 6 month FU  "pain in my neck and shoulder, can't turn head or lift arms to side, dissipating some; had severe pain with diclofenac so I stopped it; still flushing and having hot flashes"    HISTORY OF PRESENT ILLNESS:   UPDATE (02/25/21, VRP): Since last visit, struggling with neck and shoulder pain, spasms. Having menopause hot flash sxs in addition to some flushing with vumerity.   UPDATE (08/20/20, VRP): Since last visit, doing well. Symptoms are stable.  Having some issues with right-sided low back pain rating to the leg, increasing dreams, neck pain, right toe tripping, fatigue issues.  Tolerating Vumerity but still having some flushing and GI symptoms.  No alleviating or aggravating factors.    UPDATE (06/19/20, VRP): Since last visit, continues to have diffuse pain and discomfort, mainly in her heels and feet. Symptoms are moderate to severe. No alleviating or aggravating factors. Tolerating supplements.  MRI is completed and confirmed diagnosis of multiple sclerosis.  Also found to have incidental left temporal arachnoid cyst.  No abnormal enhancing lesions were noted.  Patient interested to start disease modifying therapy, but prefers oral agent if possible.  PRIOR HPI 55 year old female here for evaluation of malaise, brain fog, weight gain, pain in feet and urinary control issues.  Symptoms started in 2021.  Went to eye doctor for double vision was diagnosed with thyroid eye disease and treated with prednisone in October 2021 with some benefit.  Interestingly her other symptoms also improved while on prednisone.  In 2000 patient was diagnosed with chronic fatigue syndrome.  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  Possibility of MS was raised but she did not have any further follow-up with neurology at that time.   REVIEW OF SYSTEMS: Full 14 system review of systems performed and negative with exception of: As per HPI.  ALLERGIES: Allergies  Allergen Reactions   Pork-Derived Products Diarrhea   Sulfa Antibiotics Hives         HOME MEDICATIONS: Outpatient Medications Prior to Visit  Medication Sig Dispense Refill   B-12, Methylcobalamin, 1000 MCG SUBL Place under the tongue daily.     Bioflavonoid Products (ESTER-C) TABS Take by mouth daily.     Cats Claw, Uncaria tomentosa, (CATS CLAW PO) Take by mouth daily.     Cholecalciferol (D3 PO) Take 125 mcg by mouth at bedtime.     Diroximel Fumarate (VUMERITY) 231 MG CPDR Take 462 mg by mouth 2 (two) times daily.     ELDERBERRY PO Take by mouth daily.     Levothyroxine Sodium 112 MCG CAPS Take by mouth daily before breakfast.     Magnesium 500 MG CAPS Take by mouth daily.     NADH-ASCORBIC ACID-SOD BICARB PO Take by mouth daily.     OVER THE COUNTER MEDICATION daily. MONOLAURIN     UNABLE TO FIND Med Name: Maca root, 500 mg     diclofenac (VOLTAREN) 75 MG EC tablet Take 1 tablet (75 mg total) by mouth 2 (two) times daily. (Patient not taking: Reported on 02/25/2021) 50 tablet 2   No facility-administered medications prior to visit.    PAST MEDICAL  HISTORY: Past Medical History:  Diagnosis Date   Allergy    Arthritis    CFS (chronic fatigue syndrome)    Depression    GERD (gastroesophageal reflux disease)    Graves disease    History of ETT    05/ 2013  per pcp note dr Sharl Ma   subtle changes ST-T waves but low risk for cad   History of Graves' disease    1999-- dx toxic thyoid nodule s/p  radioactive iodine   History of palpitations    05/ 2013  normal 24 hour halter monitor   Hypothyroidism, postradioiodine therapy    1999--  grave's  (hyperthyroidism)   Menorrhagia    Thyroid eye disease     PAST SURGICAL HISTORY: Past Surgical History:  Procedure Laterality Date   DILATION AND EVACUATION  2005   DILITATION & CURRETTAGE/HYSTROSCOPY WITH NOVASURE ABLATION N/A 01/14/2016   Procedure: DILATATION & CURETTAGE/HYSTEROSCOPY WITH NOVASURE ABLATION;  Surgeon: Zelphia Cairo, MD;  Location: [facility];  Service: Gynecology;  Laterality: N/A;   TONSILLECTOMY  age 29    FAMILY HISTORY: Family History  Problem Relation Age of Onset   Heart Problems Father    CAD Father    Alzheimer's disease Maternal Grandfather    Colon polyps Neg Hx    Colon cancer Neg Hx    Esophageal cancer Neg Hx    Rectal cancer Neg Hx    Stomach cancer Neg Hx     SOCIAL HISTORY: Social History   Socioeconomic History   Marital status: Single    Spouse name: Not on file   Number of children: 0   Years of education: Not on file   Highest education level: Master's degree (e.g., MA, MS, MEng, MEd, MSW, MBA)  Occupational History   Not on file  Tobacco Use   Smoking status: Former    Years: 16.00    Types: Cigarettes    Quit date: 01/08/2000    Years since quitting: 21.1   Smokeless tobacco: Never  Substance and Sexual Activity   Alcohol use: Yes    Alcohol/week: 1.0 - 2.0 standard drink    Types: 1 - 2 Standard drinks or equivalent per week    Comment: occasional   Drug use: No   Sexual activity: Yes    Birth control/protection: None  Other Topics Concern   Not on file  Social History Narrative   Lives alone   Caffeine- coffee 1 c daily   Social Determinants of Health   Financial Resource Strain: Not on file  Food Insecurity: Not on file  Transportation Needs: Not on file  Physical Activity: Not on file  Stress: Not on file  Social Connections: Not on file  Intimate Partner Violence: Not on file     PHYSICAL EXAM  GENERAL EXAM/CONSTITUTIONAL: Vitals:  Vitals:   02/25/21 0841  BP: 130/81  Pulse: 85   Weight: 233 lb (105.7 kg)  Height: 5\' 6"  (1.676 m)    Body mass index is 37.61 kg/m. Wt Readings from Last 3 Encounters:  02/25/21 233 lb (105.7 kg)  08/20/20 226 lb 9.6 oz (102.8 kg)  05/22/20 225 lb (102.1 kg)   Patient is in no distress; well developed, nourished and groomed; neck is supple  CARDIOVASCULAR: Examination of carotid arteries is normal; no carotid bruits Regular rate and rhythm, no murmurs Examination of peripheral vascular system by observation and palpation is normal  EYES: Ophthalmoscopic exam of optic discs and posterior segments is normal; no papilledema  or hemorrhages No results found.  MUSCULOSKELETAL: Gait, strength, tone, movements noted in Neurologic exam below  NEUROLOGIC: MENTAL STATUS:  No flowsheet data found. awake, alert, oriented to person, place and time recent and remote memory intact normal attention and concentration language fluent, comprehension intact, naming intact fund of knowledge appropriate  CRANIAL NERVE:  2nd - no papilledema on fundoscopic exam 2nd, 3rd, 4th, 6th - pupils equal and reactive to light, visual fields full to confrontation, extraocular muscles intact, no nystagmus 5th - facial sensation symmetric 7th - facial strength symmetric 8th - hearing intact 9th - palate elevates symmetrically, uvula midline 11th - shoulder shrug symmetric 12th - tongue protrusion midline  MOTOR:  normal bulk and tone, full strength in the BUE, BLE  SENSORY:  normal and symmetric to light touch, temperature, vibration  COORDINATION:  finger-nose-finger, fine finger movements normal  REFLEXES:  deep tendon reflexes present and symmetric  GAIT/STATION:  narrow based gait     DIAGNOSTIC DATA (LABS, IMAGING, TESTING) - I reviewed patient records, labs, notes, testing and imaging myself where available.  Lab Results  Component Value Date   WBC 5.7 08/20/2020   HGB 13.8 08/20/2020   HCT 41.2 08/20/2020   MCV 99 (H)  08/20/2020   PLT 298 08/20/2020      Component Value Date/Time   NA 141 08/20/2020 0931   K 4.9 08/20/2020 0931   CL 101 08/20/2020 0931   CO2 24 08/20/2020 0931   GLUCOSE 92 08/20/2020 0931   BUN 11 08/20/2020 0931   CREATININE 1.15 (H) 08/20/2020 0931   CALCIUM 10.4 (H) 08/20/2020 0931   PROT 7.0 08/20/2020 0931   ALBUMIN 4.6 08/20/2020 0931   AST 16 08/20/2020 0931   ALT 20 08/20/2020 0931   ALKPHOS 98 08/20/2020 0931   BILITOT 0.2 08/20/2020 0931   No results found for: CHOL, HDL, LDLCALC, LDLDIRECT, TRIG, CHOLHDL No results found for: DGUY4I No results found for: VITAMINB12 No results found for: TSH   03/20/20 - 04/03/20 labs [facility]) SPEP - no M-protein Lead - < 1 TTG IgA - < 2 B12 1999 FTAab - non reactive ANA - negative HCV ab - 0.14 (non-reactive) HIV - non-reactive A1c- 5.7 CBC - WBC 10.5, H/H 14.5 / 46.1, PLT 327 CMP - BUN/cr 15/1.2 TSH - 1.408 FT4 - 1.33 T3 total - 68.93 Vit D - 50.7 Ferritin - 48.5  Iron - 126  05/22/20 [facility] labs - HTLV, VZV, HepB, HepC, ANCA, ANA, TB - all negative    JCV Index Value 3.39   JCV Antibody Positive Abnormal      06/04/20 MRI brain without contrast demonstrating: -Multiple scattered periventricular subcortical, pericallosal T2 hyperintensities. These findings are non-specific and considerations include autoimmune, inflammatory, post-infectious, microvascular ischemic or migraine associated etiologies.  -Left anterior temporal T2 flair hyperintense lesion may represent autoimmune, inflammatory or neoplastic process.     06/04/20 MRI cervical spine without contrast demonstrating: -Subtle intrinsic spinal cord T2 STIR hyperintensities at C2-3 on the right and C3-4 on the left. May be seen with autoimmune, inflammatory, demyelinating or postinfectious conditions.   06/11/20 MRI brain w - No abnormal lesions on postcontrast views. - Stable left anterior temporal cystic lesion without enhancement.  Most likely  represents arachnoid cyst.   06/11/20 MRI cervical spine w -No abnormal enhancing spinal cord lesions.  Previously noted T2 hyperintense spinal cord lesions are not visualized on this dedicated postcontrast study.    ASSESSMENT AND PLAN  55 y.o. year old female here  with prior transverse myelitis in 2004 at T8-9 level, now with numbness, malaise, fatigue, brain fog, double vision since 2021.     Dx:  1. Relapsing remitting multiple sclerosis (HCC)      PLAN:  RRMS (relapsing remitting multiple sclerosis; first attack 2004 with transverse myelitis, then recurrent symptoms in 2021) - continue vumerity; check labs - repeat MRI brain in Feb 2023  MUSCLE SPASMS - start cyclobenzaprine 5-10mg  as needed   FATIGUE - consider modafinil - consider sleep study  LOW BACK PAIN (radiating to right leg) - continue PT exercises at home; may consider MRI lumbar spine in future  Meds ordered this encounter  Medications   cyclobenzaprine (FLEXERIL) 5 MG tablet    Sig: Take 1-2 tablets (5-10 mg total) by mouth 3 (three) times daily as needed for muscle spasms.    Dispense:  30 tablet    Refill:  3   Orders Placed This Encounter  Procedures   CBC with Differential/Platelet   Comprehensive metabolic panel   VITAMIN D 25 Hydroxy (Vit-D Deficiency, Fractures)    Follow Up Instructions:  - Return in about 6 months (around 08/26/2021).     Suanne Marker, MD 02/25/2021, 7:29 PM Certified in Neurology, Neurophysiology and Neuroimaging  [facility] [address] [place], [place] [address] [phone]

## 2021-02-26 LAB — CBC WITH DIFFERENTIAL/PLATELET
Basophils Absolute: 0 10*3/uL (ref 0.0–0.2)
Basos: 1 %
EOS (ABSOLUTE): 0.1 10*3/uL (ref 0.0–0.4)
Eos: 2 %
Hematocrit: 39.1 % (ref 34.0–46.6)
Hemoglobin: 13.3 g/dL (ref 11.1–15.9)
Immature Grans (Abs): 0 10*3/uL (ref 0.0–0.1)
Immature Granulocytes: 0 %
Lymphocytes Absolute: 0.5 10*3/uL — ABNORMAL LOW (ref 0.7–3.1)
Lymphs: 11 %
MCH: 33.3 pg — ABNORMAL HIGH (ref 26.6–33.0)
MCHC: 34 g/dL (ref 31.5–35.7)
MCV: 98 fL — ABNORMAL HIGH (ref 79–97)
Monocytes Absolute: 0.5 10*3/uL (ref 0.1–0.9)
Monocytes: 12 %
Neutrophils Absolute: 3 10*3/uL (ref 1.4–7.0)
Neutrophils: 74 %
Platelets: 307 10*3/uL (ref 150–450)
RBC: 3.99 x10E6/uL (ref 3.77–5.28)
RDW: 12.9 % (ref 11.7–15.4)
WBC: 4.1 10*3/uL (ref 3.4–10.8)

## 2021-02-26 LAB — COMPREHENSIVE METABOLIC PANEL
ALT: 38 IU/L — ABNORMAL HIGH (ref 0–32)
AST: 25 IU/L (ref 0–40)
Albumin/Globulin Ratio: 2.1 (ref 1.2–2.2)
Albumin: 4.7 g/dL (ref 3.8–4.9)
Alkaline Phosphatase: 108 IU/L (ref 44–121)
BUN/Creatinine Ratio: 11 (ref 9–23)
BUN: 11 mg/dL (ref 6–24)
Bilirubin Total: 0.2 mg/dL (ref 0.0–1.2)
CO2: 22 mmol/L (ref 20–29)
Calcium: 10.6 mg/dL — ABNORMAL HIGH (ref 8.7–10.2)
Chloride: 102 mmol/L (ref 96–106)
Creatinine, Ser: 1.04 mg/dL — ABNORMAL HIGH (ref 0.57–1.00)
Globulin, Total: 2.2 g/dL (ref 1.5–4.5)
Glucose: 108 mg/dL — ABNORMAL HIGH (ref 70–99)
Potassium: 5 mmol/L (ref 3.5–5.2)
Sodium: 139 mmol/L (ref 134–144)
Total Protein: 6.9 g/dL (ref 6.0–8.5)
eGFR: 64 mL/min/{1.73_m2} (ref >59)

## 2021-02-26 LAB — VITAMIN D 25 HYDROXY (VIT D DEFICIENCY, FRACTURES): Vit D, 25-Hydroxy: 60.9 ng/mL (ref 30.0–100.0)

## 2021-02-27 ENCOUNTER — Ambulatory Visit: Payer: Commercial Managed Care - PPO | Admitting: Podiatry

## 2021-03-20 MED ORDER — BACLOFEN 10 MG PO TABS: ORAL_TABLET | ORAL | 4 refills | Status: DC

## 2021-03-26 MED ORDER — PREGABALIN 50 MG PO CAPS: 50.0000 mg | ORAL_CAPSULE | Freq: Three times a day (TID) | ORAL | 2 refills | Status: DC

## 2021-03-26 NOTE — Telephone Encounter (Signed)
Start pregabalin 50mg  at bedtime x 1 week; then 50mg  twice a day x 1 week; then 50mg  three times a day as tolerated.  Meds ordered this encounter  Medications   DISCONTD: baclofen (LIORESAL) 10 MG tablet    Sig: Take 0.5-1 tablet twice daily    Dispense:  60 each    Refill:  4   pregabalin (LYRICA) 50 MG capsule    Sig: Take 1 capsule (50 mg total) by mouth 3 (three) times daily.    Dispense:  90 capsule    Refill:  2    , MD 03/26/2021, 4:32 PM Certified in Neurology, Neurophysiology and Neuroimaging  [facility] [address] [place], [facility] [address] [phone]

## 2021-04-23 ENCOUNTER — Encounter: Payer: Self-pay | Admitting: Diagnostic Neuroimaging

## 2021-04-24 ENCOUNTER — Other Ambulatory Visit: Payer: Self-pay | Admitting: *Deleted

## 2021-04-24 DIAGNOSIS — G35 Multiple sclerosis: Secondary | ICD-10-CM

## 2021-05-21 ENCOUNTER — Encounter: Payer: Self-pay | Admitting: Diagnostic Neuroimaging

## 2021-05-21 ENCOUNTER — Ambulatory Visit (INDEPENDENT_AMBULATORY_CARE_PROVIDER_SITE_OTHER): Payer: Commercial Managed Care - PPO | Admitting: Diagnostic Neuroimaging

## 2021-05-21 VITALS — BP 123/83 | HR 78 | Ht 66.0 in | Wt 221.0 lb

## 2021-05-21 DIAGNOSIS — G35 Multiple sclerosis: Secondary | ICD-10-CM

## 2021-05-21 NOTE — Progress Notes (Signed)
[facility]  PATIENT: Laurie Travis DOB: 02-Jan-1966  REFERRING CLINICIAN: Eliezer Lofts, MD HISTORY FROM: patient REASON FOR VISIT: follow up   HISTORICAL  CHIEF COMPLAINT:  Chief Complaint  Patient presents with   Follow-up    RM 6 Alone  Pt is well, here to discuss medications.     HISTORY OF PRESENT ILLNESS:   UPDATE (05/21/21, VRP): Since last visit, had been sick around Nov / Dec 2022. Stopped vumerity around that time. Now on wahl's protocol, and feeling better. Using a vibration platform. Overall feeling better off meds. No recurrent symptoms.   UPDATE (02/25/21, VRP): Since last visit, struggling with neck and shoulder pain, spasms. Having menopause hot flash sxs in addition to some flushing with vumerity.   UPDATE (08/20/20, VRP): Since last visit, doing well. Symptoms are stable.  Having some issues with right-sided low back pain rating to the leg, increasing dreams, neck pain, right toe tripping, fatigue issues.  Tolerating Vumerity but still having some flushing and GI symptoms.  No alleviating or aggravating factors.    UPDATE (06/19/20, VRP): Since last visit, continues to have diffuse pain and discomfort, mainly in her heels and feet. Symptoms are moderate to severe. No alleviating or aggravating factors. Tolerating supplements.  MRI is completed and confirmed diagnosis of multiple sclerosis.  Also found to have incidental left temporal arachnoid cyst.  No abnormal enhancing lesions were noted.  Patient interested to start disease modifying therapy, but prefers oral agent if possible.  PRIOR HPI 56 year old female here for evaluation of malaise, brain fog, weight gain, pain in feet and urinary control issues.  Symptoms started in 2021.  Went to eye doctor for double vision was diagnosed with thyroid eye disease and treated with prednisone in October 2021 with some benefit.  Interestingly her other symptoms also improved while on prednisone.  In 2000  patient was diagnosed with chronic fatigue syndrome.  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  Possibility of MS was raised but she did not have any further follow-up with neurology at that time.   REVIEW OF SYSTEMS: Full 14 system review of systems performed and negative with exception of: As per HPI.  ALLERGIES: Allergies  Allergen Reactions   Pork-Derived Products Diarrhea   Sulfa Antibiotics Hives         HOME MEDICATIONS: Outpatient Medications Prior to Visit  Medication Sig Dispense Refill   B-12, Methylcobalamin, 1000 MCG SUBL Place under the tongue daily.     Bioflavonoid Products (ESTER-C) TABS Take by mouth daily.     Cats Claw, Uncaria tomentosa, (CATS CLAW PO) Take by mouth daily.     Cholecalciferol (D3 PO) Take 125 mcg by mouth at bedtime.     Diroximel Fumarate (VUMERITY) 231 MG CPDR Take 462 mg by mouth 2 (two) times daily.     ELDERBERRY PO Take by mouth daily.     Levothyroxine Sodium 112 MCG CAPS Take by mouth daily before breakfast.     Magnesium 500 MG CAPS Take by mouth daily.     NADH-ASCORBIC ACID-SOD BICARB PO Take by mouth daily.     OVER THE COUNTER MEDICATION daily. MONOLAURIN     UNABLE TO FIND Med Name: Maca root, 500 mg     cyclobenzaprine (FLEXERIL) 5 MG tablet Take 1-2 tablets (5-10 mg total) by mouth 3 (three) times daily as needed for muscle spasms. 30 tablet 3  diclofenac (VOLTAREN) 75 MG EC tablet Take 1 tablet (75 mg total) by mouth 2 (two) times daily. (Patient not taking: Reported on 02/25/2021) 50 tablet 2   pregabalin (LYRICA) 50 MG capsule Take 1 capsule (50 mg total) by mouth 3 (three) times daily. 90 capsule 2   No facility-administered medications prior to visit.    PAST MEDICAL HISTORY: Past Medical History:  Diagnosis Date   Allergy    Arthritis    CFS (chronic fatigue syndrome)    Depression    GERD (gastroesophageal reflux disease)     Graves disease    History of ETT    05/ 2013  per pcp note dr Sharl Ma   subtle changes ST-T waves but low risk for cad   History of Graves' disease    1999-- dx toxic thyoid nodule s/p  radioactive iodine   History of palpitations    05/ 2013  normal 24 hour halter monitor   Hypothyroidism, postradioiodine therapy    1999--  grave's (hyperthyroidism)   Menorrhagia    Thyroid eye disease     PAST SURGICAL HISTORY: Past Surgical History:  Procedure Laterality Date   DILATION AND EVACUATION  2005   DILITATION & CURRETTAGE/HYSTROSCOPY WITH NOVASURE ABLATION N/A 01/14/2016   Procedure: DILATATION & CURETTAGE/HYSTEROSCOPY WITH NOVASURE ABLATION;  Surgeon: Zelphia Cairo, MD;  Location: [facility];  Service: Gynecology;  Laterality: N/A;   TONSILLECTOMY  age 60    FAMILY HISTORY: Family History  Problem Relation Age of Onset   Heart Problems Father    CAD Father    Alzheimer's disease Maternal Grandfather    Colon polyps Neg Hx    Colon cancer Neg Hx    Esophageal cancer Neg Hx    Rectal cancer Neg Hx    Stomach cancer Neg Hx     SOCIAL HISTORY: Social History   Socioeconomic History   Marital status: Single    Spouse name: Not on file   Number of children: 0   Years of education: Not on file   Highest education level: Master's degree (e.g., MA, MS, MEng, MEd, MSW, MBA)  Occupational History   Not on file  Tobacco Use   Smoking status: Former    Years: 16.00    Types: Cigarettes    Quit date: 01/08/2000    Years since quitting: 21.3   Smokeless tobacco: Never  Substance and Sexual Activity   Alcohol use: Yes    Alcohol/week: 1.0 - 2.0 standard drink    Types: 1 - 2 Standard drinks or equivalent per week    Comment: occasional   Drug use: No   Sexual activity: Yes    Birth control/protection: None  Other Topics Concern   Not on file  Social History Narrative   Lives alone   Caffeine- coffee 1 c daily   Social Determinants of Health    Financial Resource Strain: Not on file  Food Insecurity: Not on file  Transportation Needs: Not on file  Physical Activity: Not on file  Stress: Not on file  Social Connections: Not on file  Intimate Partner Violence: Not on file     PHYSICAL EXAM  GENERAL EXAM/CONSTITUTIONAL: Vitals:  Vitals:   05/21/21 1348  BP: 123/83  Pulse: 78  Weight: 221 lb (100.2 kg)  Height:  (1.676 m)    Body mass index is 35.67 kg/m. Wt Readings from Last 3 Encounters:  05/21/21 221 lb (100.2 kg)  02/25/21 233 lb (105.7 kg)  08/20/20 226  lb 9.6 oz (102.8 kg)   Patient is in no distress; well developed, nourished and groomed; neck is supple  CARDIOVASCULAR: Examination of carotid arteries is normal; no carotid bruits Regular rate and rhythm, no murmurs Examination of peripheral vascular system by observation and palpation is normal  EYES: Ophthalmoscopic exam of optic discs and posterior segments is normal; no papilledema or hemorrhages No results found.  MUSCULOSKELETAL: Gait, strength, tone, movements noted in Neurologic exam below  NEUROLOGIC: MENTAL STATUS:  No flowsheet data found. awake, alert, oriented to person, place and time recent and remote memory intact normal attention and concentration language fluent, comprehension intact, naming intact fund of knowledge appropriate  CRANIAL NERVE:  2nd - no papilledema on fundoscopic exam 2nd, 3rd, 4th, 6th - pupils equal and reactive to light, visual fields full to confrontation, extraocular muscles intact, no nystagmus 5th - facial sensation symmetric 7th - facial strength symmetric 8th - hearing intact 9th - palate elevates symmetrically, uvula midline 11th - shoulder shrug symmetric 12th - tongue protrusion midline  MOTOR:  normal bulk and tone, full strength in the BUE, BLE  SENSORY:  normal and symmetric to light touch, temperature, vibration  COORDINATION:  finger-nose-finger, fine finger movements  normal  REFLEXES:  deep tendon reflexes present and symmetric  GAIT/STATION:  narrow based gait     DIAGNOSTIC DATA (LABS, IMAGING, TESTING) - I reviewed patient records, labs, notes, testing and imaging myself where available.  Lab Results  Component Value Date   WBC 4.1 02/25/2021   HGB 13.3 02/25/2021   HCT 39.1 02/25/2021   MCV 98 (H) 02/25/2021   PLT 307 02/25/2021      Component Value Date/Time   NA 139 02/25/2021 0926   K 5.0 02/25/2021 0926   CL 102 02/25/2021 0926   CO2 22 02/25/2021 0926   GLUCOSE 108 (H) 02/25/2021 0926   BUN 11 02/25/2021 0926   CREATININE 1.04 (H) 02/25/2021 0926   CALCIUM 10.6 (H) 02/25/2021 0926   PROT 6.9 02/25/2021 0926   ALBUMIN 4.7 02/25/2021 0926   AST 25 02/25/2021 0926   ALT 38 (H) 02/25/2021 0926   ALKPHOS 108 02/25/2021 0926   BILITOT 0.2 02/25/2021 0926   02/25/21 vitamin D Component Ref Range & Units 2 mo ago   Vit D, 25-Hydroxy 30.0 - 100.0 ng/mL 60.9     No results found for: CHOL, HDL, LDLCALC, LDLDIRECT, TRIG, CHOLHDL No results found for: JOIN8M No results found for: VITAMINB12 No results found for: TSH   03/20/20 - 04/03/20 labs [facility]) SPEP - no M-protein Lead - < 1 TTG IgA - < 2 B12 1999 FTAab - non reactive ANA - negative HCV ab - 0.14 (non-reactive) HIV - non-reactive A1c- 5.7 CBC - WBC 10.5, H/H 14.5 / 46.1, PLT 327 CMP - BUN/cr 15/1.2 TSH - 1.408 FT4 - 1.33 T3 total - 68.93 Vit D - 50.7 Ferritin - 48.5  Iron - 126  05/22/20 [facility] labs - HTLV, VZV, HepB, HepC, ANCA, ANA, TB - all negative    JCV Index Value 3.39   JCV Antibody Positive Abnormal      06/04/20 MRI brain without contrast demonstrating: -Multiple scattered periventricular subcortical, pericallosal T2 hyperintensities. These findings are non-specific and considerations include autoimmune, inflammatory, post-infectious, microvascular ischemic or migraine associated etiologies.  -Left anterior temporal T2 flair  hyperintense lesion may represent autoimmune, inflammatory or neoplastic process.     06/04/20 MRI cervical spine without contrast demonstrating: -Subtle intrinsic spinal cord T2 STIR hyperintensities at  C2-3 on the right and C3-4 on the left. May be seen with autoimmune, inflammatory, demyelinating or postinfectious conditions.   06/11/20 MRI brain w - No abnormal lesions on postcontrast views. - Stable left anterior temporal cystic lesion without enhancement.  Most likely represents arachnoid cyst.   06/11/20 MRI cervical spine w -No abnormal enhancing spinal cord lesions.  Previously noted T2 hyperintense spinal cord lesions are not visualized on this dedicated postcontrast study.    ASSESSMENT AND PLAN  56 y.o. year old female here with prior transverse myelitis in 2004 at T8-9 level, now with numbness, malaise, fatigue, brain fog, double vision since 2021.     Dx:  1. Relapsing remitting multiple sclerosis (HCC)      PLAN:  RRMS (relapsing remitting multiple sclerosis; first attack 2004 with transverse myelitis, then recurrent symptoms in 2021) - repeat MRI brain  - continue wahl's protocol; hold off on DMT for now   No orders of the defined types were placed in this encounter.  Orders Placed This Encounter  Procedures   MR BRAIN W WO CONTRAST   CBC with Differential/Platelet   Comprehensive metabolic panel   VITAMIN D 25 Hydroxy (Vit-D Deficiency, Fractures)    Follow Up Instructions:  - Return in about 1 year (around 05/21/2022).     Suanne Marker, MD 05/21/2021, 2:52 PM Certified in Neurology, Neurophysiology and Neuroimaging  [facility] [address], [address] [place], [place] [address] [phone]

## 2021-05-22 LAB — CBC WITH DIFFERENTIAL/PLATELET
Basophils Absolute: 0 10*3/uL (ref 0.0–0.2)
Basos: 1 %
EOS (ABSOLUTE): 0.1 10*3/uL (ref 0.0–0.4)
Eos: 2 %
Hematocrit: 41.2 % (ref 34.0–46.6)
Hemoglobin: 13.5 g/dL (ref 11.1–15.9)
Immature Grans (Abs): 0 10*3/uL (ref 0.0–0.1)
Immature Granulocytes: 0 %
Lymphocytes Absolute: 1 10*3/uL (ref 0.7–3.1)
Lymphs: 19 %
MCH: 32.1 pg (ref 26.6–33.0)
MCHC: 32.8 g/dL (ref 31.5–35.7)
MCV: 98 fL — ABNORMAL HIGH (ref 79–97)
Monocytes Absolute: 0.6 10*3/uL (ref 0.1–0.9)
Monocytes: 11 %
Neutrophils Absolute: 3.6 10*3/uL (ref 1.4–7.0)
Neutrophils: 67 %
Platelets: 338 10*3/uL (ref 150–450)
RBC: 4.2 x10E6/uL (ref 3.77–5.28)
RDW: 12.2 % (ref 11.7–15.4)
WBC: 5.3 10*3/uL (ref 3.4–10.8)

## 2021-05-22 LAB — COMPREHENSIVE METABOLIC PANEL
ALT: 19 IU/L (ref 0–32)
AST: 16 IU/L (ref 0–40)
Albumin/Globulin Ratio: 1.8 (ref 1.2–2.2)
Albumin: 4.5 g/dL (ref 3.8–4.9)
Alkaline Phosphatase: 132 IU/L — ABNORMAL HIGH (ref 44–121)
BUN/Creatinine Ratio: 11 (ref 9–23)
BUN: 11 mg/dL (ref 6–24)
Bilirubin Total: <0.2 mg/dL (ref 0.0–1.2)
CO2: 23 mmol/L (ref 20–29)
Calcium: 10.5 mg/dL — ABNORMAL HIGH (ref 8.7–10.2)
Chloride: 103 mmol/L (ref 96–106)
Creatinine, Ser: 1 mg/dL (ref 0.57–1.00)
Globulin, Total: 2.5 g/dL (ref 1.5–4.5)
Glucose: 100 mg/dL — ABNORMAL HIGH (ref 70–99)
Potassium: 4.5 mmol/L (ref 3.5–5.2)
Sodium: 141 mmol/L (ref 134–144)
Total Protein: 7 g/dL (ref 6.0–8.5)
eGFR: 67 mL/min/{1.73_m2} (ref >59)

## 2021-05-22 LAB — VITAMIN D 25 HYDROXY (VIT D DEFICIENCY, FRACTURES): Vit D, 25-Hydroxy: 72.3 ng/mL (ref 30.0–100.0)

## 2021-05-28 ENCOUNTER — Telehealth: Payer: Self-pay | Admitting: Diagnostic Neuroimaging

## 2021-05-28 NOTE — Telephone Encounter (Signed)
UM[facility] auth: NPR spoke to [facility] ref # [id number] order sent to GI, they will reach out to the patient to schedule.

## 2021-06-05 ENCOUNTER — Telehealth: Payer: Self-pay | Admitting: *Deleted

## 2021-06-05 ENCOUNTER — Encounter: Payer: Self-pay | Admitting: *Deleted

## 2021-06-05 NOTE — Telephone Encounter (Signed)
Vumerity PA< key [id number], sent to plan.

## 2021-06-05 NOTE — Telephone Encounter (Signed)
Vumerity approved 06/05/21-06/05/22. Sent my chart to advise her. Approval letter faxed to [facility] pharmacy.

## 2021-06-10 ENCOUNTER — Ambulatory Visit
Admission: RE | Admit: 2021-06-10 | Discharge: 2021-06-10 | Disposition: A | Discharge status: Home / Self Care | Payer: Commercial Managed Care - PPO | Source: Ambulatory Visit | Attending: Diagnostic Neuroimaging | Admitting: Diagnostic Neuroimaging

## 2021-06-10 ENCOUNTER — Other Ambulatory Visit: Payer: Self-pay

## 2021-06-10 DIAGNOSIS — G35 Multiple sclerosis: Secondary | ICD-10-CM | POA: Diagnosis not present

## 2021-06-10 MED ORDER — GADOBENATE DIMEGLUMINE 529 MG/ML IV SOLN
20.0000 mL | Freq: Once | INTRAVENOUS | Status: AC | PRN
  Administered 2021-06-10: 20 mL via INTRAVENOUS

## 2021-06-12 ENCOUNTER — Encounter: Payer: Self-pay | Admitting: *Deleted

## 2021-06-19 ENCOUNTER — Encounter: Payer: Self-pay | Admitting: *Deleted

## 2021-08-26 ENCOUNTER — Ambulatory Visit: Payer: Commercial Managed Care - PPO | Admitting: Diagnostic Neuroimaging

## 2021-10-04 ENCOUNTER — Encounter: Payer: Self-pay | Admitting: Diagnostic Neuroimaging

## 2021-10-07 ENCOUNTER — Telehealth: Payer: Self-pay | Admitting: *Deleted

## 2021-10-07 NOTE — Telephone Encounter (Signed)
Received labs from Dr Aura Camps office via my chart. Placed on MD desk for review.

## 2022-04-10 ENCOUNTER — Other Ambulatory Visit: Payer: Self-pay | Admitting: Family Medicine

## 2022-04-10 DIAGNOSIS — E210 Primary hyperparathyroidism: Secondary | ICD-10-CM

## 2022-04-10 DIAGNOSIS — E063 Autoimmune thyroiditis: Secondary | ICD-10-CM

## 2022-04-10 DIAGNOSIS — E890 Postprocedural hypothyroidism: Secondary | ICD-10-CM

## 2022-04-10 DIAGNOSIS — G35 Multiple sclerosis: Secondary | ICD-10-CM

## 2022-04-24 ENCOUNTER — Ambulatory Visit
Admission: RE | Admit: 2022-04-24 | Discharge: 2022-04-24 | Disposition: A | Discharge status: Home / Self Care | Payer: Commercial Managed Care - PPO | Source: Ambulatory Visit | Attending: Family Medicine | Admitting: Family Medicine

## 2022-04-24 DIAGNOSIS — E210 Primary hyperparathyroidism: Secondary | ICD-10-CM

## 2022-04-24 DIAGNOSIS — E063 Autoimmune thyroiditis: Secondary | ICD-10-CM

## 2022-04-24 DIAGNOSIS — E890 Postprocedural hypothyroidism: Secondary | ICD-10-CM

## 2022-04-24 DIAGNOSIS — G35 Multiple sclerosis: Secondary | ICD-10-CM

## 2022-05-07 ENCOUNTER — Encounter: Payer: Self-pay | Admitting: Diagnostic Neuroimaging

## 2022-05-14 ENCOUNTER — Telehealth: Payer: Self-pay | Admitting: Diagnostic Neuroimaging

## 2022-05-14 NOTE — Telephone Encounter (Signed)
LVM and sent mychart msg informing pt of r/s needed for 1/15 appt- MD out. 

## 2022-05-20 NOTE — Progress Notes (Unsigned)
[facility]  PATIENT: Laurie Travis DOB: Oct 16, 1965  REFERRING CLINICIAN: Dulce Sellar, MD HISTORY FROM: patient REASON FOR VISIT: follow up   HISTORICAL  CHIEF COMPLAINT:  No chief complaint on file.   HISTORY OF PRESENT ILLNESS:   Update 05/21/2022 JM: Returns for 1 year follow-up.  Since last visit, has been stable.  She remains off Vumerity and not currently on medication management for MS.      UPDATE (05/21/21, VRP): Since last visit, had been sick around Nov / Dec 2022. Stopped vumerity around that time. Now on wahl's protocol, and feeling better. Using a vibration platform. Overall feeling better off meds. No recurrent symptoms.   UPDATE (02/25/21, VRP): Since last visit, struggling with neck and shoulder pain, spasms. Having menopause hot flash sxs in addition to some flushing with vumerity.   UPDATE (08/20/20, VRP): Since last visit, doing well. Symptoms are stable.  Having some issues with right-sided low back pain rating to the leg, increasing dreams, neck pain, right toe tripping, fatigue issues.  Tolerating Vumerity but still having some flushing and GI symptoms.  No alleviating or aggravating factors.    UPDATE (06/19/20, VRP): Since last visit, continues to have diffuse pain and discomfort, mainly in her heels and feet. Symptoms are moderate to severe. No alleviating or aggravating factors. Tolerating supplements.  MRI is completed and confirmed diagnosis of multiple sclerosis.  Also found to have incidental left temporal arachnoid cyst.  No abnormal enhancing lesions were noted.  Patient interested to start disease modifying therapy, but prefers oral agent if possible.  PRIOR HPI 57 year old female here for evaluation of malaise, brain fog, weight gain, pain in feet and urinary control issues.  Symptoms started in 2021.  Went to eye doctor for double vision was diagnosed with thyroid eye disease and treated with prednisone in October 2021 with some  benefit.  Interestingly her other symptoms also improved while on prednisone.  In 2000 patient was diagnosed with chronic fatigue syndrome.  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  Possibility of MS was raised but she did not have any further follow-up with neurology at that time.   REVIEW OF SYSTEMS: Full 14 system review of systems performed and negative with exception of: As per HPI.  ALLERGIES: Allergies  Allergen Reactions   Pork-Derived Products Diarrhea   Sulfa Antibiotics Hives         HOME MEDICATIONS: Outpatient Medications Prior to Visit  Medication Sig Dispense Refill   B-12, Methylcobalamin, 1000 MCG SUBL Place under the tongue daily.     Bioflavonoid Products (ESTER-C) TABS Take by mouth daily.     Cats Claw, Uncaria tomentosa, (CATS CLAW PO) Take by mouth daily.     Cholecalciferol (D3 PO) Take 125 mcg by mouth at bedtime.     Diroximel Fumarate (VUMERITY) 231 MG CPDR Take 462 mg by mouth 2 (two) times daily.     ELDERBERRY PO Take by mouth daily.     Levothyroxine Sodium 112 MCG CAPS Take by mouth daily before breakfast.     Magnesium 500 MG CAPS Take by mouth daily.     NADH-ASCORBIC ACID-SOD BICARB PO Take by mouth daily.     OVER THE COUNTER MEDICATION daily. MONOLAURIN     UNABLE TO FIND Med Name: Maca root, 500 mg     No facility-administered medications prior to visit.    PAST MEDICAL HISTORY: Past  Medical History:  Diagnosis Date   Allergy    Arthritis    CFS (chronic fatigue syndrome)    Depression    GERD (gastroesophageal reflux disease)    Graves disease    History of ETT    05/ 2013  per pcp note dr Buddy Duty   subtle changes ST-T waves but low risk for cad   History of Graves' disease    1999-- dx toxic thyoid nodule s/p  radioactive iodine   History of palpitations    05/ 2013  normal 24 hour halter monitor   Hypothyroidism, postradioiodine therapy     1999--  grave's (hyperthyroidism)   Menorrhagia    Thyroid eye disease     PAST SURGICAL HISTORY: Past Surgical History:  Procedure Laterality Date   DILATION AND EVACUATION  2005   [place] N/A 01/14/2016   Procedure: DILATATION & CURETTAGE/HYSTEROSCOPY WITH NOVASURE ABLATION;  Surgeon: Marylynn Pearson, MD;  Location: [place];  Service: Gynecology;  Laterality: N/A;   TONSILLECTOMY  age 79    FAMILY HISTORY: Family History  Problem Relation Age of Onset   Heart Problems Father    CAD Father    Alzheimer's disease Maternal Grandfather    Colon polyps Neg Hx    Colon cancer Neg Hx    Esophageal cancer Neg Hx    Rectal cancer Neg Hx    Stomach cancer Neg Hx     SOCIAL HISTORY: Social History   Socioeconomic History   Marital status: Single    Spouse name: Not on file   Number of children: 0   Years of education: Not on file   Highest education level: Master's degree (e.g., MA, MS, MEng, MEd, MSW, MBA)  Occupational History   Not on file  Tobacco Use   Smoking status: Former    Years: 16.00    Types: Cigarettes    Quit date: 01/08/2000    Years since quitting: 22.3   Smokeless tobacco: Never  Substance and Sexual Activity   Alcohol use: Yes    Alcohol/week: 1.0 - 2.0 standard drink of alcohol    Types: 1 - 2 Standard drinks or equivalent per week    Comment: occasional   Drug use: No   Sexual activity: Yes    Birth control/protection: None  Other Topics Concern   Not on file  Social History Narrative   Lives alone   Caffeine- coffee 1 c daily   Social Determinants of Health   Financial Resource Strain: Not on file  Food Insecurity: Not on file  Transportation Needs: Not on file  Physical Activity: Not on file  Stress: Not on file  Social Connections: Not on file  Intimate Partner Violence: Not on file     PHYSICAL EXAM  GENERAL EXAM/CONSTITUTIONAL: Vitals:  There were no vitals  filed for this visit.   There is no height or weight on file to calculate BMI. Wt Readings from Last 3 Encounters:  05/21/21 221 lb (100.2 kg)  02/25/21 233 lb (105.7 kg)  08/20/20 226 lb 9.6 oz (102.8 kg)   Patient is in no distress; well developed, nourished and groomed; neck is supple  CARDIOVASCULAR: Examination of carotid arteries is normal; no carotid bruits Regular rate and rhythm, no murmurs Examination of peripheral vascular system by observation and palpation is normal  EYES: Ophthalmoscopic exam of optic discs and posterior segments is normal; no papilledema or hemorrhages No results found.  MUSCULOSKELETAL: Gait, strength, tone, movements  noted in Neurologic exam below  NEUROLOGIC: MENTAL STATUS:      No data to display         awake, alert, oriented to person, place and time recent and remote memory intact normal attention and concentration language fluent, comprehension intact, naming intact fund of knowledge appropriate  CRANIAL NERVE:  2nd - no papilledema on fundoscopic exam 2nd, 3rd, 4th, 6th - pupils equal and reactive to light, visual fields full to confrontation, extraocular muscles intact, no nystagmus 5th - facial sensation symmetric 7th - facial strength symmetric 8th - hearing intact 9th - palate elevates symmetrically, uvula midline 11th - shoulder shrug symmetric 12th - tongue protrusion midline  MOTOR:  normal bulk and tone, full strength in the BUE, BLE  SENSORY:  normal and symmetric to light touch, temperature, vibration  COORDINATION:  finger-nose-finger, fine finger movements normal  REFLEXES:  deep tendon reflexes present and symmetric  GAIT/STATION:  narrow based gait     DIAGNOSTIC DATA (LABS, IMAGING, TESTING) - I reviewed patient records, labs, notes, testing and imaging myself where available.  Lab Results  Component Value Date   WBC 5.3 05/21/2021   HGB 13.5 05/21/2021   HCT 41.2 05/21/2021   MCV 98 (H)  05/21/2021   PLT 338 05/21/2021      Component Value Date/Time   NA 141 05/21/2021 1457   K 4.5 05/21/2021 1457   CL 103 05/21/2021 1457   CO2 23 05/21/2021 1457   GLUCOSE 100 (H) 05/21/2021 1457   BUN 11 05/21/2021 1457   CREATININE 1.00 05/21/2021 1457   CALCIUM 10.5 (H) 05/21/2021 1457   PROT 7.0 05/21/2021 1457   ALBUMIN 4.5 05/21/2021 1457   AST 16 05/21/2021 1457   ALT 19 05/21/2021 1457   ALKPHOS 132 (H) 05/21/2021 1457   BILITOT <0.2 05/21/2021 1457   02/25/21 vitamin D Component Ref Range & Units 2 mo ago   Vit D, 25-Hydroxy 30.0 - 100.0 ng/mL 60.9     No results found for: "CHOL", "HDL", "LDLCALC", "LDLDIRECT", "TRIG", "CHOLHDL" No results found for: "HGBA1C" No results found for: "VITAMINB12" No results found for: "TSH"   03/20/20 - 04/03/20 labs [facility]) SPEP - no M-protein Lead - < 1 TTG IgA - < 2 B12 1999 FTAab - non reactive ANA - negative HCV ab - 0.14 (non-reactive) HIV - non-reactive A1c- 5.7 CBC - WBC 10.5, H/H 14.5 / 46.1, PLT 327 CMP - BUN/cr 15/1.2 TSH - 1.408 FT4 - 1.33 T3 total - 68.93 Vit D - 50.7 Ferritin - 48.5  Iron - 126  05/22/20 [facility] labs - HTLV, VZV, HepB, HepC, ANCA, ANA, TB - all negative    JCV Index Value 3.39   JCV Antibody Positive Abnormal      06/04/20 MRI brain without contrast demonstrating: -Multiple scattered periventricular subcortical, pericallosal T2 hyperintensities. These findings are non-specific and considerations include autoimmune, inflammatory, post-infectious, microvascular ischemic or migraine associated etiologies.  -Left anterior temporal T2 flair hyperintense lesion may represent autoimmune, inflammatory or neoplastic process.     06/04/20 MRI cervical spine without contrast demonstrating: -Subtle intrinsic spinal cord T2 STIR hyperintensities at C2-3 on the right and C3-4 on the left. May be seen with autoimmune, inflammatory, demyelinating or postinfectious conditions.   06/11/20 MRI  brain w - No abnormal lesions on postcontrast views. - Stable left anterior temporal cystic lesion without enhancement.  Most likely represents arachnoid cyst.   06/11/20 MRI cervical spine w -No abnormal enhancing spinal cord lesions.  Previously  noted T2 hyperintense spinal cord lesions are not visualized on this dedicated postcontrast study.    ASSESSMENT AND PLAN  57 y.o. year old female here with prior transverse myelitis in 2004 at T8-9 level, now with numbness, malaise, fatigue, brain fog, double vision since 2021.     Dx:  No diagnosis found.    PLAN:  RRMS (relapsing remitting multiple sclerosis; first attack 2004 with transverse myelitis, then recurrent symptoms in 2021) -MRI brain 05/2021 no new areas of demyelination - continue wahl's protocol; hold off on DMT for now   No orders of the defined types were placed in this encounter.  No orders of the defined types were placed in this encounter.   Follow Up Instructions:  - No follow-ups on file.       I spent *** minutes of face-to-face and non-face-to-face time with patient.  This included previsit chart review, lab review, study review, order entry, electronic health record documentation, patient education  Ihor Austin, [facility]  [facility] [address] [place], [place] [address]  Phone [phone] Fax [phone] Note: This document was prepared with digital dictation and possible smart phrase technology. Any transcriptional errors that result from this process are unintentional.

## 2022-05-21 ENCOUNTER — Ambulatory Visit (INDEPENDENT_AMBULATORY_CARE_PROVIDER_SITE_OTHER): Payer: Commercial Managed Care - PPO | Admitting: Adult Health

## 2022-05-21 ENCOUNTER — Encounter: Payer: Self-pay | Admitting: Adult Health

## 2022-05-21 VITALS — BP 152/84 | HR 69 | Ht 66.0 in | Wt 232.4 lb

## 2022-05-21 DIAGNOSIS — G35 Multiple sclerosis: Secondary | ICD-10-CM

## 2022-05-21 NOTE — Patient Instructions (Addendum)
Your Plan:  You will be called to repeat MRI brain to evaluate for any progression of MS  Will complete accomodation forms for you, you will be called once they are completed    Follow up in 1 year or call earlier if needed     Thank you for coming to see [place] at [facility]. I hope we have been able to provide you high quality care today.  You may receive a patient satisfaction survey over the next few weeks. We would appreciate your feedback and comments so that we may continue to improve ourselves and the health of our patients.

## 2022-05-25 ENCOUNTER — Ambulatory Visit: Payer: Commercial Managed Care - PPO | Admitting: Diagnostic Neuroimaging

## 2022-05-28 ENCOUNTER — Telehealth: Payer: Self-pay

## 2022-05-28 NOTE — Telephone Encounter (Signed)
Accommodations forms received, placed on NP desk for review and signature.

## 2022-06-01 NOTE — Telephone Encounter (Signed)
Completed and placed in outbox.  Thank you.

## 2022-06-01 NOTE — Telephone Encounter (Signed)
Forms placed in MR for pick up  

## 2022-06-04 ENCOUNTER — Telehealth: Payer: Self-pay | Admitting: Adult Health

## 2022-06-04 ENCOUNTER — Encounter: Payer: Self-pay | Admitting: Adult Health

## 2022-06-04 NOTE — Telephone Encounter (Signed)
Can you assist with this?

## 2022-06-04 NOTE — Telephone Encounter (Signed)
[facility] Xxx NPR case #[id number] sent to GI [phone]

## 2022-06-17 ENCOUNTER — Other Ambulatory Visit: Payer: Self-pay

## 2022-06-17 ENCOUNTER — Emergency Department (HOSPITAL_BASED_OUTPATIENT_CLINIC_OR_DEPARTMENT_OTHER): Payer: Commercial Managed Care - PPO | Admitting: Radiology

## 2022-06-17 ENCOUNTER — Emergency Department (HOSPITAL_BASED_OUTPATIENT_CLINIC_OR_DEPARTMENT_OTHER)
Admission: EM | Admit: 2022-06-17 | Discharge: 2022-06-17 | Disposition: A | Discharge status: Home / Self Care | Payer: Commercial Managed Care - PPO | Attending: Emergency Medicine | Admitting: Emergency Medicine

## 2022-06-17 DIAGNOSIS — B338 Other specified viral diseases: Secondary | ICD-10-CM

## 2022-06-17 DIAGNOSIS — B974 Respiratory syncytial virus as the cause of diseases classified elsewhere: Secondary | ICD-10-CM | POA: Insufficient documentation

## 2022-06-17 DIAGNOSIS — R059 Cough, unspecified: Secondary | ICD-10-CM | POA: Insufficient documentation

## 2022-06-17 DIAGNOSIS — Z20822 Contact with and (suspected) exposure to covid-19: Secondary | ICD-10-CM | POA: Insufficient documentation

## 2022-06-17 DIAGNOSIS — E039 Hypothyroidism, unspecified: Secondary | ICD-10-CM | POA: Insufficient documentation

## 2022-06-17 LAB — RESP PANEL BY RT-PCR (RSV, FLU A&B, COVID)  RVPGX2
Influenza A by PCR: NEGATIVE
Influenza B by PCR: NEGATIVE
Resp Syncytial Virus by PCR: POSITIVE — AB
SARS Coronavirus 2 by RT PCR: NEGATIVE

## 2022-06-17 NOTE — ED Triage Notes (Signed)
Pt in with dry, nonproductive cough since Saturday. Pt states she had some sinus congestion last week, and has been taking Mucinex around the clock, but has a hx of MS and is concerned she may have PNA. Denies any fevers

## 2022-06-17 NOTE — ED Provider Notes (Signed)
[place] Provider Note   CSN: [id number] Arrival date & time: 06/17/22  0434     History  Chief Complaint  Patient presents with   Cough    Laurie Travis is a 57 y.o. female.  Patient is a 57 year old female with past medical history of MS, hypothyroidism.  Patient presenting today with complaints of cough.  This has been worsening over the past 5 days.  Cough is intermittently productive of yellow sputum.  She feels as though the congestion is deep in her chest.  She denies shortness of breath.  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  She has been taking Mucinex with little relief and has done home COVID test that have returned negative.  The history is provided by the patient.       Home Medications Prior to Admission medications   Medication Sig Start Date End Date Taking? Authorizing Provider  B-12, Methylcobalamin, 1000 MCG SUBL Place under the tongue daily.    [provider]  Bioflavonoid Products (ESTER-C) TABS Take by mouth daily.    [provider]  Cholecalciferol (D3 PO) Take 125 mcg by mouth at bedtime.    [provider]  Levothyroxine Sodium 112 MCG CAPS Take by mouth daily before breakfast.    [provider]  NADH-ASCORBIC ACID-SOD BICARB PO Take by mouth daily.    [provider]  NON FORMULARY Take 1 capsule by mouth daily. Liothyronine sodium/thyroxine-L SR 19-83 mcg    [provider]  OVER THE COUNTER MEDICATION daily. MONOLAURIN    [provider]  progesterone (PROMETRIUM) 100 MG capsule Take 100 mg by mouth daily.    [provider]      Allergies    Pork-derived products and Sulfa antibiotics    Review of Systems   Review of Systems  All other systems reviewed and are negative.   Physical Exam Updated Vital Signs Pulse 65   Temp 98.2 F (36.8 C) (Oral)   Wt 102.1 kg   SpO2 95%   BMI 36.32  kg/m  Physical Exam Vitals and nursing note reviewed.  Constitutional:      General: She is not in acute distress.    Appearance: She is well-developed. She is not diaphoretic.  HENT:     Head: Normocephalic and atraumatic.  Cardiovascular:     Rate and Rhythm: Normal rate and regular rhythm.     Heart sounds: No murmur heard.    No friction rub. No gallop.  Pulmonary:     Effort: Pulmonary effort is normal. No respiratory distress.     Breath sounds: Normal breath sounds. No wheezing.  Abdominal:     General: Bowel sounds are normal. There is no distension.     Palpations: Abdomen is soft.     Tenderness: There is no abdominal tenderness.  Musculoskeletal:        General: Normal range of motion.     Cervical back: Normal range of motion and neck supple.  Skin:    General: Skin is warm and dry.  Neurological:     General: No focal deficit present.     Mental Status: She is alert and oriented to person, place, and time.     ED Results / Procedures / Treatments   Labs (all labs ordered are listed, but only abnormal results are displayed) Labs Reviewed  RESP PANEL BY RT-PCR (RSV, FLU A&B, COVID)  RVPGX2  EKG None  Radiology No results found.  Procedures Procedures    Medications Ordered in ED Medications - No data to display  ED Course/ Medical Decision Making/ A&P  Patient is a 57 year old female presenting with URI symptoms for the past several days.  She arrives here with stable vital signs with no fever or hypoxia.  Physical examination reveals no respiratory distress and basically clear lungs.  Chest x-ray obtained showing no evidence for acute cardiopulmonary process.  Nasal swab also obtained testing positive for RSV, but negative for flu and COVID.  Patient to be discharged with continued use of her steroid inhaler.  She also has a history of MS and has prednisone at home.  I have advised her she could try taking prednisone 20 mg twice daily as this may  help with her congestion/inflammation.  Other than that, over-the-counter medications, plenty of fluids, and time.  Final Clinical Impression(s) / ED Diagnoses Final diagnoses:  None    Rx / DC Orders ED Discharge Orders     None         Veryl Speak, MD 06/17/22 [phone]

## 2022-06-17 NOTE — Discharge Instructions (Signed)
Begin taking prednisone, 20 mg twice daily for the next several days.  Continue over-the-counter medications as needed for relief of symptoms.  Follow-up with primary doctor if not improving in the next week, and return to the ER if symptoms significantly worsen or change.

## 2022-10-07 ENCOUNTER — Telehealth: Payer: Self-pay | Admitting: Diagnostic Neuroimaging

## 2022-10-07 NOTE — Telephone Encounter (Signed)
Sent mychart msg informing pt of appt change due to provider schedule change 

## 2023-05-06 ENCOUNTER — Telehealth: Payer: Self-pay | Admitting: Adult Health

## 2023-05-06 NOTE — Telephone Encounter (Signed)
Pt states a new order is needed for her MRI, please call pt to discuss.

## 2023-05-24 ENCOUNTER — Ambulatory Visit: Payer: Commercial Managed Care - PPO | Admitting: Diagnostic Neuroimaging

## 2023-05-27 ENCOUNTER — Ambulatory Visit: Payer: Commercial Managed Care - PPO | Admitting: Diagnostic Neuroimaging

## 2023-09-15 ENCOUNTER — Encounter: Payer: Self-pay | Admitting: Diagnostic Neuroimaging

## 2023-09-15 DIAGNOSIS — G35 Multiple sclerosis: Secondary | ICD-10-CM

## 2023-09-16 NOTE — Addendum Note (Signed)
 Addended by: Elton Ham on: 09/16/2023 09:17 AM   Modules accepted: Orders

## 2023-09-23 ENCOUNTER — Encounter: Payer: Self-pay | Admitting: Diagnostic Neuroimaging

## 2023-10-11 ENCOUNTER — Ambulatory Visit
Admission: RE | Admit: 2023-10-11 | Discharge: 2023-10-11 | Disposition: A | Discharge status: Home / Self Care | Source: Ambulatory Visit | Attending: Diagnostic Neuroimaging | Admitting: Diagnostic Neuroimaging

## 2023-10-11 DIAGNOSIS — G35 Multiple sclerosis: Secondary | ICD-10-CM

## 2023-10-13 ENCOUNTER — Ambulatory Visit
Admission: RE | Admit: 2023-10-13 | Discharge: 2023-10-13 | Disposition: A | Discharge status: Home / Self Care | Source: Ambulatory Visit | Attending: Diagnostic Neuroimaging | Admitting: Diagnostic Neuroimaging

## 2023-10-13 DIAGNOSIS — G35 Multiple sclerosis: Secondary | ICD-10-CM | POA: Diagnosis not present

## 2023-10-13 MED ORDER — GADOPICLENOL 0.5 MMOL/ML IV SOLN
10.0000 mL | Freq: Once | INTRAVENOUS | Status: AC | PRN
  Administered 2023-10-13: 10 mL via INTRAVENOUS

## 2023-10-19 ENCOUNTER — Ambulatory Visit: Payer: Self-pay | Admitting: Diagnostic Neuroimaging

## 2023-10-26 ENCOUNTER — Encounter: Payer: Self-pay | Admitting: Diagnostic Neuroimaging

## 2023-10-26 ENCOUNTER — Ambulatory Visit (INDEPENDENT_AMBULATORY_CARE_PROVIDER_SITE_OTHER): Payer: Commercial Managed Care - PPO | Admitting: Diagnostic Neuroimaging

## 2023-10-26 VITALS — BP 133/88 | HR 86 | Ht 66.0 in | Wt 230.0 lb

## 2023-10-26 DIAGNOSIS — G35 Multiple sclerosis: Secondary | ICD-10-CM | POA: Diagnosis not present

## 2023-10-26 DIAGNOSIS — H9192 Unspecified hearing loss, left ear: Secondary | ICD-10-CM

## 2023-10-26 NOTE — Patient Instructions (Addendum)
 RRMS (relapsing remitting multiple sclerosis) - stable overall; doing well - continue wahl's protocol  LEFT SIDED HEARING LOSS (after MRI brain on 10/13/23) - refer to ENT / audiology

## 2023-10-26 NOTE — Progress Notes (Signed)
 [facility]  PATIENT: Laurie Travis DOB: [birth year]  REFERRING CLINICIAN: Kahoano, Haku K, MD HISTORY FROM: patient REASON FOR VISIT: follow up   HISTORICAL  CHIEF COMPLAINT:  Chief Complaint  Patient presents with   Multiple Sclerosis    RM 7 alone  Pt is well and stable, reports no new MS concerns but does mention hearing loss in L ear post MRI.     HISTORY OF PRESENT ILLNESS:   UPDATE (10/26/23, VRP): Since last visit, doing well. Symptoms are stable. Had MRI recently.   UPDATE (05/21/21, VRP): Since last visit, had been sick around Nov / Dec 2022. Stopped vumerity around that time. Now on wahl's protocol, and feeling better. Using a vibration platform. Overall feeling better off meds. No recurrent symptoms.   UPDATE (02/25/21, VRP): Since last visit, struggling with neck and shoulder pain, spasms. Having menopause hot flash sxs in addition to some flushing with vumerity.   UPDATE (08/20/20, VRP): Since last visit, doing well. Symptoms are stable.  Having some issues with right-sided low back pain rating to the leg, increasing dreams, neck pain, right toe tripping, fatigue issues.  Tolerating Vumerity but still having some flushing and GI symptoms.  No alleviating or aggravating factors.    UPDATE (06/19/20, VRP): Since last visit, continues to have diffuse pain and discomfort, mainly in her heels and feet. Symptoms are moderate to severe. No alleviating or aggravating factors. Tolerating supplements.  MRI is completed and confirmed diagnosis of multiple sclerosis.  Also found to have incidental left temporal arachnoid cyst.  No abnormal enhancing lesions were noted.  Patient interested to start disease modifying therapy, but prefers oral agent if possible.  PRIOR HPI 58 year old female here for evaluation of malaise, brain fog, weight gain, pain in feet and urinary control issues.  Symptoms started in 2021.  Went to eye doctor for double vision was diagnosed with  thyroid  eye disease and treated with prednisone in October 2021 with some benefit.  Interestingly her other symptoms also improved while on prednisone.  In 2000 patient was diagnosed with chronic fatigue syndrome.  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  Possibility of MS was raised but she did not have any further follow-up with neurology at that time.   REVIEW OF SYSTEMS: Full 14 system review of systems performed and negative with exception of: As per HPI.  ALLERGIES: Allergies  Allergen Reactions   Pork-Derived Products Diarrhea   Sulfa Antibiotics Hives         HOME MEDICATIONS: Outpatient Medications Prior to Visit  Medication Sig Dispense Refill   B-12, Methylcobalamin, 1000 MCG SUBL Place under the tongue daily.     Bioflavonoid Products (ESTER-C) TABS Take by mouth daily.     Levothyroxine Sodium 112 MCG CAPS Take by mouth daily before breakfast.     NADH-ASCORBIC ACID-SOD BICARB PO Take by mouth daily.     NALTREXONE HCL, PAIN, PO 1.5 mg.     NON FORMULARY Take 1 capsule by mouth daily. Liothyronine sodium/thyroxine-L SR 19-83 mcg     OVER THE COUNTER MEDICATION daily. MONOLAURIN     valACYclovir (VALTREX) 1000 MG tablet Take 500 mg by mouth daily.     Cholecalciferol (D3 PO) Take 125 mcg by mouth at bedtime. (Patient not taking: Reported on 10/26/2023)     progesterone (PROMETRIUM) 100 MG capsule Take 100 mg by mouth daily.     No facility-administered  medications prior to visit.    PAST MEDICAL HISTORY: Past Medical History:  Diagnosis Date   Allergy    Arthritis    CFS (chronic fatigue syndrome)    Depression    GERD (gastroesophageal reflux disease)    Graves disease    History of ETT    05/ 2013  per pcp note dr Kathyanne Parkers   subtle changes ST-T waves but low risk for cad   History of Graves' disease    1999-- dx toxic thyoid nodule s/p  radioactive iodine   History of  palpitations    05/ 2013  normal 24 hour halter monitor   Hypothyroidism, postradioiodine therapy    1999--  grave's (hyperthyroidism)   Menorrhagia    Thyroid  eye disease     PAST SURGICAL HISTORY: Past Surgical History:  Procedure Laterality Date   DILATION AND EVACUATION  2005   DILITATION & CURRETTAGE/HYSTROSCOPY WITH NOVASURE ABLATION N/A 01/14/2016   Procedure: DILATATION & CURETTAGE/HYSTEROSCOPY WITH NOVASURE ABLATION;  Surgeon: Ashby Lawman, MD;  Location: [facility];  Service: Gynecology;  Laterality: N/A;   TONSILLECTOMY  age 30    FAMILY HISTORY: Family History  Problem Relation Age of Onset   Heart Problems Father    CAD Father    Alzheimer's disease Maternal Grandfather    Colon polyps Neg Hx    Colon cancer Neg Hx    Esophageal cancer Neg Hx    Rectal cancer Neg Hx    Stomach cancer Neg Hx     SOCIAL HISTORY: Social History   Socioeconomic History   Marital status: Single    Spouse name: Not on file   Number of children: 0   Years of education: Not on file   Highest education level: Master's degree (e.g., MA, MS, MEng, MEd, MSW, MBA)  Occupational History   Not on file  Tobacco Use   Smoking status: Former    Current packs/day: 0.00    Types: Cigarettes    Start date: 01/08/1984    Quit date: 01/08/2000    Years since quitting: 23.8   Smokeless tobacco: Never  Substance and Sexual Activity   Alcohol use: Yes    Alcohol/week: 1.0 - 2.0 standard drink of alcohol    Types: 1 - 2 Standard drinks or equivalent per week    Comment: occasional   Drug use: No   Sexual activity: Yes    Birth control/protection: None  Other Topics Concern   Not on file  Social History Narrative   Lives alone   Caffeine- coffee 1 c daily   Social Drivers of Corporate investment banker Strain: Not on file  Food Insecurity: Not on file  Transportation Needs: Not on file  Physical Activity: Not on file  Stress: Not on file  Social Connections: Not  on file  Intimate Partner Violence: Not on file     PHYSICAL EXAM  GENERAL EXAM/CONSTITUTIONAL: Vitals:  There were no vitals filed for this visit.   There is no height or weight on file to calculate BMI. Wt Readings from Last 3 Encounters:  06/17/22 225 lb (102.1 kg)  05/21/22 232 lb 6.4 oz (105.4 kg)  05/21/21 221 lb (100.2 kg)   Patient is in no distress; well developed, nourished and groomed; neck is supple  CARDIOVASCULAR: Examination of carotid arteries is normal; no carotid bruits Regular rate and rhythm, no murmurs Examination of peripheral vascular system by observation and palpation is normal  EYES: Ophthalmoscopic exam of optic discs  and posterior segments is normal; no papilledema or hemorrhages No results found.  MUSCULOSKELETAL: Gait, strength, tone, movements noted in Neurologic exam below  NEUROLOGIC: MENTAL STATUS:      No data to display         awake, alert, oriented to person, place and time recent and remote memory intact normal attention and concentration language fluent, comprehension intact, naming intact fund of knowledge appropriate  CRANIAL NERVE:  2nd - no papilledema on fundoscopic exam 2nd, 3rd, 4th, 6th - pupils equal and reactive to light, visual fields full to confrontation, extraocular muscles intact, no nystagmus 5th - facial sensation symmetric 7th - facial strength symmetric 8th - hearing intact 9th - palate elevates symmetrically, uvula midline 11th - shoulder shrug symmetric 12th - tongue protrusion midline  MOTOR:  normal bulk and tone, full strength in the BUE, BLE  SENSORY:  normal and symmetric to light touch, temperature, vibration  COORDINATION:  finger-nose-finger, fine finger movements normal  REFLEXES:  deep tendon reflexes present and symmetric  GAIT/STATION:  narrow based gait     DIAGNOSTIC DATA (LABS, IMAGING, TESTING) - I reviewed patient records, labs, notes, testing and imaging myself  where available.  Lab Results  Component Value Date   WBC 5.3 05/21/2021   HGB 13.5 05/21/2021   HCT 41.2 05/21/2021   MCV 98 (H) 05/21/2021   PLT 338 05/21/2021      Component Value Date/Time   NA 141 05/21/2021 1457   K 4.5 05/21/2021 1457   CL 103 05/21/2021 1457   CO2 23 05/21/2021 1457   GLUCOSE 100 (H) 05/21/2021 1457   BUN 11 05/21/2021 1457   CREATININE 1.00 05/21/2021 1457   CALCIUM 10.5 (H) 05/21/2021 1457   PROT 7.0 05/21/2021 1457   ALBUMIN 4.5 05/21/2021 1457   AST 16 05/21/2021 1457   ALT 19 05/21/2021 1457   ALKPHOS 132 (H) 05/21/2021 1457   BILITOT <0.2 05/21/2021 1457   02/25/21 vitamin D Component Ref Range & Units 2 mo ago   Vit D, 25-Hydroxy 30.0 - 100.0 ng/mL 60.9     No results found for: CHOL, HDL, LDLCALC, LDLDIRECT, TRIG, CHOLHDL No results found for: HYQM5H No results found for: VITAMINB12 No results found for: TSH   03/20/20 - 04/03/20 labs [facility]) SPEP - no M-protein Lead - < 1 TTG IgA - < 2 B12 1999 FTAab - non reactive ANA - negative HCV ab - 0.14 (non-reactive) HIV - non-reactive A1c- 5.7 CBC - WBC 10.5, H/H 14.5 / 46.1, PLT 327 CMP - BUN/cr 15/1.2 TSH - 1.408 FT4 - 1.33 T3 total - 68.93 Vit D - 50.7 Ferritin - 48.5  Iron - 126  05/22/20 [facility] labs - HTLV, VZV, HepB, HepC, ANCA, ANA, TB - all negative    JCV Index Value 3.39   JCV Antibody Positive Abnormal      06/04/20 MRI brain without contrast demonstrating: -Multiple scattered periventricular subcortical, pericallosal T2 hyperintensities. These findings are non-specific and considerations include autoimmune, inflammatory, post-infectious, microvascular ischemic or migraine associated etiologies.  -Left anterior temporal T2 flair hyperintense lesion may represent autoimmune, inflammatory or neoplastic process.     06/04/20 MRI cervical spine without contrast demonstrating: -Subtle intrinsic spinal cord T2 STIR hyperintensities at C2-3 on  the right and C3-4 on the left. May be seen with autoimmune, inflammatory, demyelinating or postinfectious conditions.   06/11/20 MRI brain w - No abnormal lesions on postcontrast views. - Stable left anterior temporal cystic lesion without enhancement.  Most likely  represents arachnoid cyst.   06/11/20 MRI cervical spine w -No abnormal enhancing spinal cord lesions.  Previously noted T2 hyperintense spinal cord lesions are not visualized on this dedicated postcontrast study.   10/13/23 MRI brain (with and without) demonstrating: - Stable chronic demyelinating plaques. - Left anterior temporal arachnoid cyst slightly larger than noted in 2023, but similar to 06/04/20 measurements. - No acute findings. No change from 06/10/21.     ASSESSMENT AND PLAN  58 y.o. year old female here with prior transverse myelitis in 2004 at T8-9 level, now with numbness, malaise, fatigue, brain fog, double vision since 2021.     Dx:  1. Relapsing remitting multiple sclerosis (HCC)     PLAN:  RRMS (relapsing remitting multiple sclerosis; first attack 2004 with transverse myelitis, then recurrent symptoms in 2021) - stable overall; doing well - continue wahl's protocol; hold off on DMT for now  LEFT SIDED HEARING LOSS (after MRI brain on 10/13/23) - refer to ENT / audiology  Orders Placed This Encounter  Procedures   Ambulatory referral to ENT   Follow Up Instructions:  - Return for pending if symptoms worsen or fail to improve.     Omega Bible, MD 10/26/2023, 3:52 PM Certified in Neurology, Neurophysiology and Neuroimaging  [facility] [address] [place], [place] [address] [phone]

## 2023-10-27 ENCOUNTER — Telehealth: Payer: Self-pay | Admitting: Diagnostic Neuroimaging

## 2023-10-27 NOTE — Telephone Encounter (Signed)
 Referral for ENT fax to [facility]. Phone: [phone], Fax: [phone]

## 2023-11-19 ENCOUNTER — Ambulatory Visit (INDEPENDENT_AMBULATORY_CARE_PROVIDER_SITE_OTHER): Admitting: Audiology

## 2023-11-19 DIAGNOSIS — H9042 Sensorineural hearing loss, unilateral, left ear, with unrestricted hearing on the contralateral side: Secondary | ICD-10-CM

## 2023-11-19 DIAGNOSIS — H903 Sensorineural hearing loss, bilateral: Secondary | ICD-10-CM

## 2023-11-19 NOTE — Progress Notes (Signed)
  [address], [address] [place], [place] [address] [phone]  Audiological Evaluation    Name: Laurie Travis     DOB:   [birth year]      MRN:   [id number]                                                                                     Service Date: 11/19/2023     Accompanied by: unaccompanied   Patient comes today after Reyes Cohen, PA-C sent a referral for a hearing evaluation due to concerns with hearing loss.   Symptoms Yes Details  Hearing loss  [x]  Left sided hearing loss since June 2. Reports had MRI test and then had a nap and the woke up with hearing loss (feeling like in a barrel) in the left ear.  Tinnitus  [x]  Reports left ear pressure/ocean like sound  Ear pain/ infections/pressure  []    Balance problems  []    Noise exposure history  []    Previous ear surgeries  []    Family history of hearing loss  []    Amplification  []    Other  []  Multiple sclerosis    Otoscopy: Right ear: Clear external ear canal and notable landmarks visualized on the tympanic membrane. Left ear:  Clear external ear canal and notable landmarks visualized on the tympanic membrane.  Tympanometry: Right ear: Type A- Normal external ear canal volume with normal middle ear pressure and tympanic membrane compliance. Left ear: Type A- Normal external ear canal volume with normal middle ear pressure and tympanic membrane compliance. *Asymmetry noted, worse in the left ear from 1500-8000Hz . *  Pure tone Audiometry: Right ear- Normal hearing from [phone] Hz.    Left ear-  Normal hearing from [phone] Hz, then mild presumably  sensorineural hearing loss from 6000 Hz - 8000 Hz.  Speech Audiometry: Right ear- Speech Reception Threshold (SRT) was obtained at 5 dBHL. Left ear-Speech Reception Threshold (SRT) was obtained at 15 dBHL.   Word Recognition Score Tested using NU-6 (recorded) Right ear: 100% was obtained at a presentation level of 55 dBHL with contralateral masking which is deemed as   excellent. Left ear: 100% was obtained at a presentation level of 55 dBHL with contralateral masking which is deemed as  excellent.   The hearing test results were completed under headphones and results are deemed to be of good reliability. Test technique:  conventional      Recommendations: Follow up with ENT as scheduled. Repeat audiogram as per MD.   ROSALINE MARIE LEROUX-MARTINEZ, AUD

## 2023-11-22 ENCOUNTER — Encounter (INDEPENDENT_AMBULATORY_CARE_PROVIDER_SITE_OTHER): Payer: Self-pay | Admitting: Physician Assistant

## 2023-11-22 ENCOUNTER — Ambulatory Visit (INDEPENDENT_AMBULATORY_CARE_PROVIDER_SITE_OTHER): Admitting: Physician Assistant

## 2023-11-22 VITALS — BP 141/83 | HR 76

## 2023-11-22 DIAGNOSIS — H6592 Unspecified nonsuppurative otitis media, left ear: Secondary | ICD-10-CM | POA: Diagnosis not present

## 2023-11-22 DIAGNOSIS — H9042 Sensorineural hearing loss, unilateral, left ear, with unrestricted hearing on the contralateral side: Secondary | ICD-10-CM

## 2023-11-22 DIAGNOSIS — H9190 Unspecified hearing loss, unspecified ear: Secondary | ICD-10-CM | POA: Diagnosis not present

## 2023-11-22 MED ORDER — LORATADINE 10 MG PO TABS: 10.0000 mg | ORAL_TABLET | Freq: Every day | ORAL | 11 refills | Status: AC

## 2023-11-22 MED ORDER — FLUTICASONE PROPIONATE 50 MCG/ACT NA SUSP: 2.0000 | Freq: Every day | NASAL | 6 refills | Status: AC

## 2023-11-22 NOTE — Progress Notes (Addendum)
 Dear Dr. Margaret, Here is my assessment for our mutual patient, Laurie Travis. Thank you for allowing me the opportunity to care for your patient. Please do not hesitate to contact me should you have any other questions. Sincerely, Chyrl Cohen PA-C  Otolaryngology Clinic Note Referring provider: Dr. Margaret HPI:  Laurie Travis is a 58 y.o. female kindly referred by Dr. Margaret   The patient is a 58 year old female seen in our office for evaluation of ear congestion and hearing loss.  The patient notes baseline hearing was normal until October 13, 2023.  She notes that she has a history of MS and went in for an MRI.  She reports taking a nap after the MRI and woke up with congestion in the left ear with some hearing loss.  She notes she has never had this before.  She denies any significant pain to the ear, no dizziness, no ringing in the ear.  She does note the sound of an ocean in the ear but this is not pulsatile.  She denies any new neurologic deficits.  She notes that she started taking Mucinex which did not help, she also took a short course of prednisone 30 mg for 4 days and then tapered over the next several days.  She notes this helped minimally.  No associated upper respiratory symptoms around this time, no history recurrent ear infections.  She notes overall her symptoms have improved from  but not back to normal.   Independent Review of Additional Tests or Records:  Audiological evaluation on 11/19/2023 Otoscopy: Right ear: Clear external ear canal and notable landmarks visualized on the tympanic membrane. Left ear:  Clear external ear canal and notable landmarks visualized on the tympanic membrane.   Tympanometry: Right ear: Type A- Normal external ear canal volume with normal middle ear pressure and tympanic membrane compliance. Left ear: Type A- Normal external ear canal volume with normal middle ear pressure and tympanic membrane compliance. *Asymmetry noted, worse in the left ear  from 1500-8000Hz . *   Pure tone Audiometry: Right ear- Normal hearing from [phone] Hz.    Left ear-  Normal hearing from [phone] Hz, then mild presumably  sensorineural hearing loss from 6000 Hz - 8000 Hz.   Speech Audiometry: Right ear- Speech Reception Threshold (SRT) was obtained at 5 dBHL. Left ear-Speech Reception Threshold (SRT) was obtained at 15 dBHL.   Word Recognition Score Tested using NU-6 (recorded) Right ear: 100% was obtained at a presentation level of 55 dBHL with contralateral masking which is deemed as  excellent. Left ear: 100% was obtained at a presentation level of 55 dBHL with contralateral masking which is deemed as  excellent.   The hearing test results were completed under headphones and results are deemed to be of good reliability. Test technique:  conventional       PMH/Meds/All/SocHx/FamHx/ROS:   Past Medical History:  Diagnosis Date   Allergy    Arthritis    CFS (chronic fatigue syndrome)    Depression    GERD (gastroesophageal reflux disease)    Graves disease    History of ETT    05/ 2013  per pcp note dr faythe   subtle changes ST-T waves but low risk for cad   History of Graves' disease    1999-- dx toxic thyoid nodule s/p  radioactive iodine   History of palpitations    05/ 2013  normal 24 hour halter monitor   Hypothyroidism, postradioiodine therapy    1999--  grave's (hyperthyroidism)  Menorrhagia    Thyroid  eye disease      Past Surgical History:  Procedure Laterality Date   DILATION AND EVACUATION  2005   DILITATION & CURRETTAGE/HYSTROSCOPY WITH NOVASURE ABLATION N/A 01/14/2016   Procedure: DILATATION & CURETTAGE/HYSTEROSCOPY WITH NOVASURE ABLATION;  Surgeon: Truman Corona, MD;  Location: [facility];  Service: Gynecology;  Laterality: N/A;   TONSILLECTOMY  age 50    Family History  Problem Relation Age of Onset   Heart Problems Father    CAD Father    Alzheimer's disease Maternal Grandfather    Colon polyps  Neg Hx    Colon cancer Neg Hx    Esophageal cancer Neg Hx    Rectal cancer Neg Hx    Stomach cancer Neg Hx      Social Connections: Not on file      Current Outpatient Medications:    B-12, Methylcobalamin, 1000 MCG SUBL, Place under the tongue daily., Disp: , Rfl:    Bioflavonoid Products (ESTER-C) TABS, Take by mouth daily., Disp: , Rfl:    Levothyroxine Sodium 112 MCG CAPS, Take by mouth daily before breakfast., Disp: , Rfl:    NADH-ASCORBIC ACID-SOD BICARB PO, Take by mouth daily., Disp: , Rfl:    NALTREXONE HCL, PAIN, PO, 1.5 mg., Disp: , Rfl:    NON FORMULARY, Take 1 capsule by mouth daily. Liothyronine sodium/thyroxine-L SR 19-83 mcg, Disp: , Rfl:    OVER THE COUNTER MEDICATION, daily. MONOLAURIN, Disp: , Rfl:    Oxytocin POWD, 50 mg by Does not apply route daily., Disp: , Rfl:    valACYclovir (VALTREX) 1000 MG tablet, Take 500 mg by mouth daily., Disp: , Rfl:    Vitamin D-Vitamin K (VITAMIN K2-VITAMIN D3 PO), Take 5,000 Units by mouth daily., Disp: , Rfl:    Physical Exam:   BP (!) 141/83   Pulse 76   SpO2 95%   Pertinent Findings   Bilateral EAC clear and TM intact with well pneumatized middle ear space on the right, left middle ear with mucoid effusion Anterior rhinoscopy: Septum right deviation; bilateral inferior turbinates with hypertrophy No lesions of oral cavity/oropharynx; dentition within normal limits No obviously neck masses/lymphadenopathy/thyromegaly No respiratory distress or stridor  Seprately Identifiable Procedures:  None  Impression & Plans:  Laurie Travis is a 58 y.o. female with the following   Hearing loss-  58 year old female seen in our office for evaluation of left-sided hearing loss.  Her audiological report shows some asymmetric hearing loss at 2000 Hz normalizing again at approximately 3000 with a another drop around 8000.  She has bilateral type a tympanometry.  On exam I do appreciate left-sided mucoid effusion.  She has a complicated  picture given the audiogram and physical exam findings, she also has history of MS.  I discussed her options including nasal endoscopy today with treatment for eustachian tube dysfunction and follow-up.  The patient was hesitant for nasal endoscopy, I do think it is reasonable to initiate Flonase  and Claritin , I would like to see her back in the office in 1 month with repeat physical exam, if she has a persistent effusion at that time she will need nasal endoscopy.  I would also like to repeat her audiogram after her next visit to assure hearing has returned back to normal.  If she still has asymmetry I will consider an MRI IAC.  Her MRI on the day of the symptoms did not show any specific lesions.  The patient is happy with this plan, she  develops any new or worsening signs or symptoms she will reach out sooner.   - f/u 1 month follow-up in office   Thank you for allowing me the opportunity to care for your patient. Please do not hesitate to contact me should you have any other questions.  Sincerely, Chyrl Cohen PA-C [facility] Phone: [phone] Fax: [phone]  11/22/2023, 3:35 PM

## 2023-12-27 ENCOUNTER — Ambulatory Visit (INDEPENDENT_AMBULATORY_CARE_PROVIDER_SITE_OTHER): Admitting: Physician Assistant
